# Patient Record
Sex: Female | Born: 2000
Health system: Southern US, Community
[De-identification: ages and names within clinical notes are randomized; demographics above are authoritative.]

## PROBLEM LIST (undated history)

## (undated) DIAGNOSIS — I10 Essential (primary) hypertension: Secondary | ICD-10-CM

## (undated) DIAGNOSIS — F99 Mental disorder, not otherwise specified: Secondary | ICD-10-CM

## (undated) HISTORY — PX: NO PAST SURGERIES: SHX2092

---

## 2019-11-13 ENCOUNTER — Other Ambulatory Visit: Payer: Self-pay

## 2019-11-13 ENCOUNTER — Emergency Department (HOSPITAL_COMMUNITY)
Admission: EM | Admit: 2019-11-13 | Discharge: 2019-11-13 | Discharge status: Against Medical Advice | Payer: BC Managed Care – PPO | Source: Home / Self Care | Attending: Emergency Medicine | Admitting: Emergency Medicine

## 2019-11-13 ENCOUNTER — Emergency Department (HOSPITAL_COMMUNITY): Payer: BC Managed Care – PPO

## 2019-11-13 ENCOUNTER — Encounter (HOSPITAL_COMMUNITY): Payer: Self-pay

## 2019-11-13 DIAGNOSIS — J982 Interstitial emphysema: Secondary | ICD-10-CM | POA: Diagnosis not present

## 2019-11-13 DIAGNOSIS — R000 Tachycardia, unspecified: Secondary | ICD-10-CM | POA: Insufficient documentation

## 2019-11-13 DIAGNOSIS — Z5320 Procedure and treatment not carried out because of patient's decision for unspecified reasons: Secondary | ICD-10-CM | POA: Insufficient documentation

## 2019-11-13 LAB — BASIC METABOLIC PANEL
Anion gap: 20 — ABNORMAL HIGH (ref 5–15)
BUN: 22 mg/dL — ABNORMAL HIGH (ref 6–20)
CO2: 21 mmol/L — ABNORMAL LOW (ref 22–32)
Calcium: 10.3 mg/dL (ref 8.9–10.3)
Chloride: 95 mmol/L — ABNORMAL LOW (ref 98–111)
Creatinine, Ser: 1.48 mg/dL — ABNORMAL HIGH (ref 0.44–1.00)
GFR calc Af Amer: 58 mL/min — ABNORMAL LOW (ref >60)
GFR calc non Af Amer: 50 mL/min — ABNORMAL LOW (ref >60)
Glucose, Bld: 102 mg/dL — ABNORMAL HIGH (ref 70–99)
Potassium: 2.5 mmol/L — CL (ref 3.5–5.1)
Sodium: 136 mmol/L (ref 135–145)

## 2019-11-13 LAB — DIFFERENTIAL
Abs Immature Granulocytes: 0.23 10*3/uL — ABNORMAL HIGH (ref 0.00–0.07)
Basophils Absolute: 0.1 10*3/uL (ref 0.0–0.1)
Basophils Relative: 1 %
Eosinophils Absolute: 0 10*3/uL (ref 0.0–0.5)
Eosinophils Relative: 0 %
Immature Granulocytes: 1 %
Lymphocytes Relative: 3 %
Lymphs Abs: 1.1 10*3/uL (ref 0.7–4.0)
Monocytes Absolute: 1.8 10*3/uL — ABNORMAL HIGH (ref 0.1–1.0)
Monocytes Relative: 5 %
Neutro Abs: 31 10*3/uL — ABNORMAL HIGH (ref 1.7–7.7)
Neutrophils Relative %: 91 %

## 2019-11-13 LAB — CBC
HCT: 43.1 % (ref 36.0–46.0)
Hemoglobin: 15.4 g/dL — ABNORMAL HIGH (ref 12.0–15.0)
MCH: 29.3 pg (ref 26.0–34.0)
MCHC: 35.7 g/dL (ref 30.0–36.0)
MCV: 82.1 fL (ref 80.0–100.0)
Platelets: 486 10*3/uL — ABNORMAL HIGH (ref 150–400)
RBC: 5.25 MIL/uL — ABNORMAL HIGH (ref 3.87–5.11)
RDW: 12.5 % (ref 11.5–15.5)
WBC: 34.4 10*3/uL — ABNORMAL HIGH (ref 4.0–10.5)
nRBC: 0 % (ref 0.0–0.2)

## 2019-11-13 LAB — TSH: TSH: 0.726 u[IU]/mL (ref 0.350–4.500)

## 2019-11-13 LAB — I-STAT BETA HCG BLOOD, ED (MC, WL, AP ONLY): I-stat hCG, quantitative: <5 m[IU]/mL (ref <5)

## 2019-11-13 LAB — TROPONIN I (HIGH SENSITIVITY): Troponin I (High Sensitivity): 22 ng/L — ABNORMAL HIGH (ref <18)

## 2019-11-13 MED ORDER — SODIUM CHLORIDE 0.9 % IV BOLUS: 1000.0000 mL | Freq: Once | INTRAVENOUS | Status: DC

## 2019-11-13 MED ORDER — SODIUM CHLORIDE 0.9% FLUSH: 3.0000 mL | Freq: Once | INTRAVENOUS | Status: DC

## 2019-11-13 NOTE — ED Triage Notes (Signed)
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 Patient was knocking on other people's apartment doors and went into one apartment and used their bathroom. Other persons stated that the patient was having bizarre behavior. No SI/HI. Patient has bruising on the right neck and right knee. Patient also has straw in her hair.

## 2019-11-13 NOTE — ED Notes (Signed)
Altered over radio that pt pulled out her IV. When this RN went to evaluate the patient, she was not in her room, not in the restrooms or nearby. Assumed that she left. Patient was alert and oriented x4, and ambulatory. Patient also denied SI and HI.

## 2019-11-13 NOTE — ED Provider Notes (Cosign Needed)
[facility] DEPT Provider Note   CSN: [id number] Arrival date & time: 11/13/19  1418     History No chief complaint on file.   Susan Morgan is a 19 y.o. female.  HPI    Patient is a 19 year old female who presents to the emergency department today for evaluation of abnormal behavior.  Patient was brought in by EMS and additional report was obtained from them.  She is a [employer] at [employer].  Reportedly, she was knocking on other people's apartment doors and went into an apartment and used her bathroom.    On my evaluation, patient states she does not know why EMS took her here.  She denies any medical complaints at this time.  She denies SI, HI, AVH.  She denies any drug or alcohol use.  History reviewed. No pertinent past medical history.  There are no problems to display for this patient.   History reviewed. No pertinent surgical history.   OB History   No obstetric history on file.     No family history on file.  Social History   Tobacco Use  . Smoking status: Never Smoker  . Smokeless tobacco: Never Used  Substance Use Topics  . Alcohol use: Never  . Drug use: Never    Home Medications Prior to Admission medications   Not on File    Allergies    Patient has no known allergies.  Review of Systems   Review of Systems  Constitutional: Negative for fever.  HENT: Negative for ear pain and sore throat.   Eyes: Negative for visual disturbance.  Respiratory: Negative for cough and shortness of breath.   Cardiovascular: Negative for chest pain.  Gastrointestinal: Negative for abdominal pain, constipation, diarrhea, nausea and vomiting.  Genitourinary: Negative for dysuria and hematuria.  Musculoskeletal: Negative for back pain.  Skin: Negative for rash.  Neurological: Negative for headaches.  Psychiatric/Behavioral:       Denies si, hi or avh  All other systems reviewed and are negative.   Physical Exam Updated Vital  Signs BP (!) 143/104   Pulse (!) 128   Temp 98.9 F (37.2 C) (Oral)   Resp 17   SpO2 100%   Physical Exam Vitals and nursing note reviewed.  Constitutional:      General: She is not in acute distress.    Appearance: She is well-developed.  HENT:     Head: Normocephalic and atraumatic.  Eyes:     Conjunctiva/sclera: Conjunctivae normal.  Cardiovascular:     Rate and Rhythm: Regular rhythm. Tachycardia present.     Heart sounds: Normal heart sounds. No murmur.  Pulmonary:     Effort: Pulmonary effort is normal. No respiratory distress.     Breath sounds: Normal breath sounds. No wheezing, rhonchi or rales.  Abdominal:     General: Bowel sounds are normal.     Palpations: Abdomen is soft.     Tenderness: There is no abdominal tenderness.  Musculoskeletal:     Cervical back: Neck supple.  Skin:    General: Skin is warm and dry.  Neurological:     Mental Status: She is alert.     Comments: Alert and oriented x3. Clear speech. No facial droop. Moving all extremities. Answers questions appropriately and follows commands.   Psychiatric:        Attention and Perception: Attention normal.        Mood and Affect: Mood is anxious (mild).        Speech: Speech  normal.        Behavior: Behavior is cooperative.        Thought Content: Thought content is not paranoid or delusional. Thought content does not include homicidal or suicidal ideation. Thought content does not include homicidal or suicidal plan.        Cognition and Memory: Memory normal.        Judgment: Judgment is not inappropriate.     Comments: Able to state president     ED Results / Procedures / Treatments   Labs (all labs ordered are listed, but only abnormal results are displayed) Labs Reviewed  BASIC METABOLIC PANEL - Abnormal; Notable for the following components:      Result Value   Potassium 2.5 (*)    Chloride 95 (*)    CO2 21 (*)    Glucose, Bld 102 (*)    BUN 22 (*)    Creatinine, Ser 1.48 (*)     GFR calc non Af Amer 50 (*)    GFR calc Af Amer 58 (*)    Anion gap 20 (*)    All other components within normal limits  CBC - Abnormal; Notable for the following components:   WBC 34.4 (*)    RBC 5.25 (*)    Hemoglobin 15.4 (*)    Platelets 486 (*)    All other components within normal limits  DIFFERENTIAL - Abnormal; Notable for the following components:   Neutro Abs 31.0 (*)    Monocytes Absolute 1.8 (*)    Abs Immature Granulocytes 0.23 (*)    All other components within normal limits  TROPONIN I (HIGH SENSITIVITY) - Abnormal; Notable for the following components:   Troponin I (High Sensitivity) 22 (*)    All other components within normal limits  TSH  I-STAT BETA HCG BLOOD, ED ([facility], [facility], [facility] ONLY)  TROPONIN I (HIGH SENSITIVITY)    EKG None  Radiology DG Chest Port 1 View  Result Date: 11/13/2019 CLINICAL DATA:  Tachycardia EXAM: PORTABLE CHEST 1 VIEW COMPARISON:  None. FINDINGS: The heart size and mediastinal contours are within normal limits. Both lungs are clear. The visualized skeletal structures are unremarkable. IMPRESSION: No active disease. Electronically Signed   By: Alcide Clever M.D.   On: 11/13/2019 15:17    Procedures Procedures (including critical care time)  Medications Ordered in ED Medications - No data to display  ED Course  I have reviewed the triage vital signs and the nursing notes.  Pertinent labs & imaging results that were available during my care of the patient were reviewed by me and considered in my medical decision making (see chart for details).    MDM Rules/Calculators/A&P                      Patient is a 19 year old female who presents for evaluation of bizarre behavior.  She was brought in by EMS after she was found to be knocking on apartment doors and went into an apartment that was not hers to use the bathroom.  On my evaluation patient is not endorsing any SI, HI or AVH.  She denies any drug or alcohol use.  She has no medical  complaints at this time and her physical exam is normal other than some tachycardia and that she appears somewhat anxious.  She is in no distress.  I explained the plan that we would order laboratory work and further assess her.  After leaving the room, patient eloped from the ED.  She does not appear to meet any criteria that would warrant IVC.  After she left, her labs resulted  CBC showed a leukocytosis of 34,000 with report thrombocytosis. BMP showed hypokalemia with a potassium of 2.5.  She also had an elevated BUN/creatinine at 22 and 1.48.  She also had an elevated anion gap at 20 and a CO2 of 21. Her troponin was marginally elevated at 22 Beta-hCG was negative TSH was negative  Due to lab abnormalities patient was attempted to be contacted multiple times to advise her to come back to the ED.  5:31 PM Called the patient's phone number that was listed in the chart to inform her of her abnormal results and need to come back to the emergency department immediately.    7:59 PM Called the patient's phone number that was listed in the chart to inform her of her abnormal results and need to come back to the emergency department immediately.    I was ultimately unable to reach the patient or discuss the risks of leaving AMA.  Final Clinical Impression(s) / ED Diagnoses Final diagnoses:  Tachycardia    Rx / DC Orders ED Discharge Orders    None       Karrie Meres, PA-C 11/13/19 2352

## 2019-11-14 ENCOUNTER — Encounter (HOSPITAL_COMMUNITY): Payer: Self-pay | Admitting: Emergency Medicine

## 2019-11-14 ENCOUNTER — Emergency Department (HOSPITAL_COMMUNITY): Payer: BC Managed Care – PPO

## 2019-11-14 ENCOUNTER — Other Ambulatory Visit: Payer: Self-pay

## 2019-11-14 ENCOUNTER — Inpatient Hospital Stay (HOSPITAL_COMMUNITY)
Admission: EM | Admit: 2019-11-14 | Discharge: 2019-11-18 | DRG: 200 | Disposition: A | Discharge status: Home / Self Care | Payer: BC Managed Care – PPO | Attending: Internal Medicine | Admitting: Internal Medicine

## 2019-11-14 DIAGNOSIS — D72829 Elevated white blood cell count, unspecified: Secondary | ICD-10-CM

## 2019-11-14 DIAGNOSIS — J982 Interstitial emphysema: Secondary | ICD-10-CM | POA: Diagnosis present

## 2019-11-14 DIAGNOSIS — E876 Hypokalemia: Secondary | ICD-10-CM | POA: Diagnosis present

## 2019-11-14 DIAGNOSIS — F23 Brief psychotic disorder: Secondary | ICD-10-CM

## 2019-11-14 DIAGNOSIS — N179 Acute kidney failure, unspecified: Secondary | ICD-10-CM

## 2019-11-14 DIAGNOSIS — R4182 Altered mental status, unspecified: Principal | ICD-10-CM

## 2019-11-14 DIAGNOSIS — M6282 Rhabdomyolysis: Secondary | ICD-10-CM | POA: Diagnosis not present

## 2019-11-14 DIAGNOSIS — R7989 Other specified abnormal findings of blood chemistry: Secondary | ICD-10-CM | POA: Diagnosis not present

## 2019-11-14 DIAGNOSIS — E860 Dehydration: Secondary | ICD-10-CM | POA: Diagnosis present

## 2019-11-14 DIAGNOSIS — F2081 Schizophreniform disorder: Secondary | ICD-10-CM | POA: Diagnosis present

## 2019-11-14 DIAGNOSIS — Z20822 Contact with and (suspected) exposure to covid-19: Secondary | ICD-10-CM | POA: Diagnosis present

## 2019-11-14 DIAGNOSIS — R7401 Elevation of levels of liver transaminase levels: Secondary | ICD-10-CM | POA: Diagnosis not present

## 2019-11-14 DIAGNOSIS — R000 Tachycardia, unspecified: Secondary | ICD-10-CM | POA: Diagnosis not present

## 2019-11-14 DIAGNOSIS — K229 Disease of esophagus, unspecified: Secondary | ICD-10-CM

## 2019-11-14 HISTORY — DX: Acute kidney failure, unspecified: N17.9

## 2019-11-14 HISTORY — DX: Brief psychotic disorder: F23

## 2019-11-14 LAB — COMPREHENSIVE METABOLIC PANEL
ALT: 22 U/L (ref 0–44)
AST: 44 U/L — ABNORMAL HIGH (ref 15–41)
Albumin: 3.1 g/dL — ABNORMAL LOW (ref 3.5–5.0)
Alkaline Phosphatase: 37 U/L — ABNORMAL LOW (ref 38–126)
Anion gap: 11 (ref 5–15)
BUN: 8 mg/dL (ref 6–20)
CO2: 20 mmol/L — ABNORMAL LOW (ref 22–32)
Calcium: 8.1 mg/dL — ABNORMAL LOW (ref 8.9–10.3)
Chloride: 106 mmol/L (ref 98–111)
Creatinine, Ser: 0.81 mg/dL (ref 0.44–1.00)
GFR calc Af Amer: >60 mL/min (ref >60)
GFR calc non Af Amer: >60 mL/min (ref >60)
Glucose, Bld: 75 mg/dL (ref 70–99)
Potassium: 3.4 mmol/L — ABNORMAL LOW (ref 3.5–5.1)
Sodium: 137 mmol/L (ref 135–145)
Total Bilirubin: 1 mg/dL (ref 0.3–1.2)
Total Protein: 5.7 g/dL — ABNORMAL LOW (ref 6.5–8.1)

## 2019-11-14 LAB — CBC
HCT: 42.5 % (ref 36.0–46.0)
Hemoglobin: 14.6 g/dL (ref 12.0–15.0)
MCH: 28.1 pg (ref 26.0–34.0)
MCHC: 34.4 g/dL (ref 30.0–36.0)
MCV: 81.7 fL (ref 80.0–100.0)
Platelets: 461 10*3/uL — ABNORMAL HIGH (ref 150–400)
RBC: 5.2 MIL/uL — ABNORMAL HIGH (ref 3.87–5.11)
RDW: 12.2 % (ref 11.5–15.5)
WBC: 24.1 10*3/uL — ABNORMAL HIGH (ref 4.0–10.5)
nRBC: 0 % (ref 0.0–0.2)

## 2019-11-14 LAB — CSF CELL COUNT WITH DIFFERENTIAL
RBC Count, CSF: 2090 /mm3 — ABNORMAL HIGH
RBC Count, CSF: 66 /mm3 — ABNORMAL HIGH
Tube #: 1
Tube #: 4
WBC, CSF: 0 /mm3 (ref 0–5)
WBC, CSF: 2 /mm3 (ref 0–5)

## 2019-11-14 LAB — CBG MONITORING, ED: Glucose-Capillary: 70 mg/dL (ref 70–99)

## 2019-11-14 LAB — URINALYSIS, ROUTINE W REFLEX MICROSCOPIC
Bilirubin Urine: NEGATIVE
Glucose, UA: NEGATIVE mg/dL
Ketones, ur: 20 mg/dL — AB
Nitrite: NEGATIVE
Protein, ur: NEGATIVE mg/dL
Specific Gravity, Urine: 1.026 (ref 1.005–1.030)
pH: 6 (ref 5.0–8.0)

## 2019-11-14 LAB — I-STAT BETA HCG BLOOD, ED (MC, WL, AP ONLY): I-stat hCG, quantitative: <5 m[IU]/mL

## 2019-11-14 LAB — COMPREHENSIVE METABOLIC PANEL WITH GFR
ALT: 27 U/L (ref 0–44)
AST: 63 U/L — ABNORMAL HIGH (ref 15–41)
Albumin: 5.4 g/dL — ABNORMAL HIGH (ref 3.5–5.0)
Alkaline Phosphatase: 60 U/L (ref 38–126)
Anion gap: 19 — ABNORMAL HIGH (ref 5–15)
BUN: 19 mg/dL (ref 6–20)
CO2: 21 mmol/L — ABNORMAL LOW (ref 22–32)
Calcium: 10 mg/dL (ref 8.9–10.3)
Chloride: 92 mmol/L — ABNORMAL LOW (ref 98–111)
Creatinine, Ser: 1.39 mg/dL — ABNORMAL HIGH (ref 0.44–1.00)
GFR calc Af Amer: >60 mL/min
GFR calc non Af Amer: 54 mL/min — ABNORMAL LOW
Glucose, Bld: 87 mg/dL (ref 70–99)
Potassium: 3.1 mmol/L — ABNORMAL LOW (ref 3.5–5.1)
Sodium: 132 mmol/L — ABNORMAL LOW (ref 135–145)
Total Bilirubin: 1.9 mg/dL — ABNORMAL HIGH (ref 0.3–1.2)
Total Protein: 9.4 g/dL — ABNORMAL HIGH (ref 6.5–8.1)

## 2019-11-14 LAB — ACETAMINOPHEN LEVEL: Acetaminophen (Tylenol), Serum: <10 ug/mL — ABNORMAL LOW (ref 10–30)

## 2019-11-14 LAB — PROTEIN AND GLUCOSE, CSF
Glucose, CSF: 65 mg/dL (ref 40–70)
Total  Protein, CSF: 22 mg/dL (ref 15–45)

## 2019-11-14 LAB — LACTIC ACID, PLASMA
Lactic Acid, Venous: 2.3 mmol/L (ref 0.5–1.9)
Lactic Acid, Venous: 1.4 mmol/L (ref 0.5–1.9)

## 2019-11-14 LAB — TROPONIN I (HIGH SENSITIVITY)
Troponin I (High Sensitivity): 32 ng/L — ABNORMAL HIGH (ref <18)
Troponin I (High Sensitivity): 23 ng/L — ABNORMAL HIGH (ref <18)

## 2019-11-14 LAB — RAPID URINE DRUG SCREEN, HOSP PERFORMED
Amphetamines: NOT DETECTED
Barbiturates: NOT DETECTED
Benzodiazepines: NOT DETECTED
Cocaine: NOT DETECTED
Opiates: NOT DETECTED
Tetrahydrocannabinol: NOT DETECTED

## 2019-11-14 LAB — RESPIRATORY PANEL BY RT PCR (FLU A&B, COVID)
Influenza A by PCR: NEGATIVE
Influenza B by PCR: NEGATIVE
SARS Coronavirus 2 by RT PCR: NEGATIVE

## 2019-11-14 LAB — CK: Total CK: 2251 U/L — ABNORMAL HIGH (ref 38–234)

## 2019-11-14 LAB — MAGNESIUM: Magnesium: 2.3 mg/dL (ref 1.7–2.4)

## 2019-11-14 LAB — SALICYLATE LEVEL: Salicylate Lvl: <7 mg/dL — ABNORMAL LOW (ref 7.0–30.0)

## 2019-11-14 LAB — HIV ANTIBODY (ROUTINE TESTING W REFLEX): HIV Screen 4th Generation wRfx: NONREACTIVE

## 2019-11-14 MED ORDER — LORAZEPAM 2 MG/ML IJ SOLN
1.0000 mg | Freq: Once | INTRAMUSCULAR | Status: AC
  Administered 2019-11-14: 1 mg via INTRAVENOUS
  Filled 2019-11-14: qty 1

## 2019-11-14 MED ORDER — LACTATED RINGERS IV SOLN: INTRAVENOUS | Status: AC

## 2019-11-14 MED ORDER — METRONIDAZOLE IN NACL 5-0.79 MG/ML-% IV SOLN: 500.0000 mg | Freq: Three times a day (TID) | INTRAVENOUS | Status: DC

## 2019-11-14 MED ORDER — ACETAMINOPHEN 650 MG RE SUPP: 650.0000 mg | Freq: Four times a day (QID) | RECTAL | Status: DC | PRN

## 2019-11-14 MED ORDER — SODIUM CHLORIDE 0.9% FLUSH
3.0000 mL | Freq: Once | INTRAVENOUS | Status: AC
  Administered 2019-11-14: 3 mL via INTRAVENOUS

## 2019-11-14 MED ORDER — ONDANSETRON HCL 4 MG/2ML IJ SOLN: 4.0000 mg | Freq: Four times a day (QID) | INTRAMUSCULAR | Status: DC | PRN

## 2019-11-14 MED ORDER — POTASSIUM CHLORIDE 10 MEQ/100ML IV SOLN
10.0000 meq | Freq: Once | INTRAVENOUS | Status: AC
  Administered 2019-11-14: 10 meq via INTRAVENOUS
  Filled 2019-11-14: qty 100

## 2019-11-14 MED ORDER — VANCOMYCIN HCL IN DEXTROSE 1-5 GM/200ML-% IV SOLN
1000.0000 mg | Freq: Once | INTRAVENOUS | Status: AC
  Administered 2019-11-14: 1000 mg via INTRAVENOUS
  Filled 2019-11-14: qty 200

## 2019-11-14 MED ORDER — SODIUM CHLORIDE 0.9 % IV BOLUS (SEPSIS)
1000.0000 mL | Freq: Once | INTRAVENOUS | Status: AC
  Administered 2019-11-14: 1000 mL via INTRAVENOUS

## 2019-11-14 MED ORDER — ENOXAPARIN SODIUM 40 MG/0.4ML ~~LOC~~ SOLN
40.0000 mg | SUBCUTANEOUS | Status: DC
  Administered 2019-11-14 – 2019-11-17 (×4): 40 mg via SUBCUTANEOUS
  Filled 2019-11-14 (×4): qty 0.4

## 2019-11-14 MED ORDER — PROPOFOL 10 MG/ML IV BOLUS
1.0000 mg/kg | Freq: Once | INTRAVENOUS | Status: AC
  Administered 2019-11-14: 54.4 mg via INTRAVENOUS
  Filled 2019-11-14: qty 20

## 2019-11-14 MED ORDER — VANCOMYCIN HCL 750 MG/150ML IV SOLN
750.0000 mg | Freq: Two times a day (BID) | INTRAVENOUS | Status: DC
  Administered 2019-11-14 – 2019-11-15 (×2): 750 mg via INTRAVENOUS
  Filled 2019-11-14 (×2): qty 150

## 2019-11-14 MED ORDER — VANCOMYCIN HCL IN DEXTROSE 750-5 MG/150ML-% IV SOLN: 750.0000 mg | Freq: Two times a day (BID) | INTRAVENOUS | Status: DC

## 2019-11-14 MED ORDER — SODIUM CHLORIDE 0.9 % IV BOLUS (SEPSIS)
500.0000 mL | Freq: Once | INTRAVENOUS | Status: AC
  Administered 2019-11-14: 500 mL via INTRAVENOUS

## 2019-11-14 MED ORDER — ONDANSETRON HCL 4 MG PO TABS: 4.0000 mg | ORAL_TABLET | Freq: Four times a day (QID) | ORAL | Status: DC | PRN

## 2019-11-14 MED ORDER — ACETAMINOPHEN 325 MG PO TABS: 650.0000 mg | ORAL_TABLET | Freq: Four times a day (QID) | ORAL | Status: DC | PRN

## 2019-11-14 MED ORDER — SODIUM CHLORIDE 0.9 % IV SOLN
2.0000 g | Freq: Two times a day (BID) | INTRAVENOUS | Status: DC
  Administered 2019-11-14 – 2019-11-15 (×3): 2 g via INTRAVENOUS
  Filled 2019-11-14 (×2): qty 2
  Filled 2019-11-14 (×2): qty 20

## 2019-11-14 MED ORDER — SODIUM CHLORIDE 0.9% FLUSH
3.0000 mL | Freq: Two times a day (BID) | INTRAVENOUS | Status: DC
  Administered 2019-11-15 – 2019-11-18 (×6): 3 mL via INTRAVENOUS

## 2019-11-14 MED ORDER — IOHEXOL 300 MG/ML  SOLN
75.0000 mL | Freq: Once | INTRAMUSCULAR | Status: AC | PRN
  Administered 2019-11-14: 75 mL via INTRAVENOUS

## 2019-11-14 MED ORDER — SODIUM CHLORIDE 0.9 % IV SOLN
2.0000 g | Freq: Once | INTRAVENOUS | Status: AC
  Administered 2019-11-14: 2 g via INTRAVENOUS
  Filled 2019-11-14: qty 2

## 2019-11-14 MED ORDER — SODIUM CHLORIDE 0.9 % IV BOLUS
500.0000 mL | Freq: Once | INTRAVENOUS | Status: AC
  Administered 2019-11-14: 500 mL via INTRAVENOUS

## 2019-11-14 MED ORDER — POTASSIUM CHLORIDE CRYS ER 20 MEQ PO TBCR
40.0000 meq | EXTENDED_RELEASE_TABLET | Freq: Once | ORAL | Status: AC
  Administered 2019-11-14: 40 meq via ORAL
  Filled 2019-11-14: qty 2

## 2019-11-14 MED ORDER — SODIUM CHLORIDE 0.9 % IV BOLUS
1000.0000 mL | Freq: Once | INTRAVENOUS | Status: AC
  Administered 2019-11-14: 1000 mL via INTRAVENOUS

## 2019-11-14 MED ORDER — METRONIDAZOLE IN NACL 5-0.79 MG/ML-% IV SOLN
500.0000 mg | Freq: Once | INTRAVENOUS | Status: AC
  Administered 2019-11-14: 500 mg via INTRAVENOUS
  Filled 2019-11-14: qty 100

## 2019-11-14 NOTE — ED Provider Notes (Signed)
[facility] EMERGENCY DEPARTMENT Provider Note  CSN: [id number] Arrival date & time: 11/14/19 0202  Chief Complaint(s) Altered Mental Status  HPI Susan Morgan is a 19 y.o. female brought into the emergency department by [employer] for altered mental status.  They were called out by patient's roommates had a college apartment complex.  The patient's roommate reports that they have not seen her all day.  He states that she is very confused and acting strange.  Patient was actually Seen at [facility] long earlier in the day note reports that the patient was reportedly locking on other people's apartment doors.  She entered an apartment and use the bathroom.   Patient is confused but oriented to time.  She believes she is in [place] currently.  Patient did endorse to smoking "trees."  Denied any alcohol use or drug use.  We do not have any contact information for family.  During her work-up at [facility] long it was noted that patient had abnormal labs including leukocytosis on CBC with evidence of hemoconcentration.  Metabolic panel notable for mild AKI and mild hypokalemia.  Her troponin at time time was also mildly elevated.  Remainder of history, ROS, and physical exam limited due to patient's condition ([facility]). Additional information was obtained from [employer] and chart.   Level V Caveat.    HPI  Past Medical History History reviewed. No pertinent past medical history. There are no problems to display for this patient.  Home Medication(s) Prior to Admission medications   Not on File                                                                                                                                    Past Surgical History History reviewed. No pertinent surgical history. Family History No family history on file.  Social History Social History   Tobacco Use  . Smoking status: Never Smoker  . Smokeless tobacco: Never Used  Substance Use Topics  . Alcohol use:  Never  . Drug use: Never   Allergies Patient has no known allergies.  Review of Systems Review of Systems  Unable to perform ROS: Mental status change    Physical Exam Vital Signs  I have reviewed the triage vital signs BP (!) 143/112 (BP Location: Right Arm)   Pulse (!) 128   Temp 98 F (36.7 C) (Oral)   Resp 16   SpO2 100%   Physical Exam Vitals reviewed.  Constitutional:      General: She is not in acute distress.    Appearance: She is well-developed. She is not diaphoretic.  HENT:     Head: Normocephalic and atraumatic.     Nose: Nose normal.  Eyes:     General: No scleral icterus.       Right eye: No discharge.        Left eye: No discharge.     Conjunctiva/sclera: Conjunctivae normal.  Pupils: Pupils are equal, round, and reactive to light.  Cardiovascular:     Rate and Rhythm: Normal rate and regular rhythm.     Heart sounds: No murmur. No friction rub. No gallop.   Pulmonary:     Effort: Pulmonary effort is normal. No respiratory distress.     Breath sounds: Normal breath sounds. No stridor. No rales.  Abdominal:     General: There is no distension.     Palpations: Abdomen is soft.     Tenderness: There is no abdominal tenderness.  Musculoskeletal:        General: No tenderness.     Cervical back: Normal range of motion and neck supple.  Skin:    General: Skin is warm and dry.     Findings: No erythema or rash.  Neurological:     Mental Status: She is alert.     Sensory: Sensation is intact.     Motor: Motor function is intact.     Gait: Gait is intact.  Psychiatric:        Mood and Affect: Mood is anxious.        Speech: Speech is rapid and pressured and tangential.        Behavior: Behavior is hyperactive.     ED Results and Treatments Labs (all labs ordered are listed, but only abnormal results are displayed) Labs Reviewed  COMPREHENSIVE METABOLIC PANEL - Abnormal; Notable for the following components:      Result Value   Sodium 132  (*)    Potassium 3.1 (*)    Chloride 92 (*)    CO2 21 (*)    Creatinine, Ser 1.39 (*)    Total Protein 9.4 (*)    Albumin 5.4 (*)    AST 63 (*)    Total Bilirubin 1.9 (*)    GFR calc non Af Amer 54 (*)    Anion gap 19 (*)    All other components within normal limits  CBC - Abnormal; Notable for the following components:   WBC 24.1 (*)    RBC 5.20 (*)    Platelets 461 (*)    All other components within normal limits  SALICYLATE LEVEL - Abnormal; Notable for the following components:   Salicylate Lvl <7.0 (*)    All other components within normal limits  ACETAMINOPHEN LEVEL - Abnormal; Notable for the following components:   Acetaminophen (Tylenol), Serum <10 (*)    All other components within normal limits  LACTIC ACID, PLASMA - Abnormal; Notable for the following components:   Lactic Acid, Venous 2.3 (*)    All other components within normal limits  TROPONIN I (HIGH SENSITIVITY) - Abnormal; Notable for the following components:   Troponin I (High Sensitivity) 32 (*)    All other components within normal limits  CSF CULTURE  GRAM STAIN  CULTURE, BLOOD (ROUTINE X 2)  CULTURE, BLOOD (ROUTINE X 2)  URINE CULTURE  RESPIRATORY PANEL BY RT PCR (FLU A&B, COVID)  MAGNESIUM  LACTIC ACID, PLASMA  URINALYSIS, ROUTINE W REFLEX MICROSCOPIC  RAPID URINE DRUG SCREEN, HOSP PERFORMED  CSF CELL COUNT WITH DIFFERENTIAL  CSF CELL COUNT WITH DIFFERENTIAL  PROTEIN AND GLUCOSE, CSF  VDRL, CSF  CBG MONITORING, ED  I-STAT BETA HCG BLOOD, ED ([facility], [facility], [facility] ONLY)  EKG  EKG Interpretation  Date/Time:    Ventricular Rate:    PR Interval:    QRS Duration:   QT Interval:    QTC Calculation:   R Axis:     Text Interpretation:        Radiology CT Head Wo Contrast  Result Date: 11/14/2019 CLINICAL DATA:  Encephalopathy EXAM: CT HEAD WITHOUT CONTRAST TECHNIQUE: Contiguous  axial images were obtained from the base of the skull through the vertex without intravenous contrast. COMPARISON:  None. FINDINGS: Brain: There is no mass, hemorrhage or extra-axial collection. The size and configuration of the ventricles and extra-axial CSF spaces are normal. The brain parenchyma is normal, without acute or chronic infarction. Vascular: No abnormal hyperdensity of the major intracranial arteries or dural venous sinuses. No intracranial atherosclerosis. Skull: The visualized skull base, calvarium and extracranial soft tissues are normal. Sinuses/Orbits: No fluid levels or advanced mucosal thickening of the visualized paranasal sinuses. No mastoid or middle ear effusion. The orbits are normal. Other: There is a large amount of retropharyngeal gas, incompletely visualized. This likely indicates air ascending from pneumomediastinum. CT of the neck is recommended. IMPRESSION: 1. No acute intracranial abnormality. 2. Large amount of retropharyngeal gas, incompletely visualized. This likely indicates air ascending from pneumomediastinum. CT of the neck is recommended. Electronically Signed   By: Ulyses Jarred M.D.   On: 11/14/2019 05:52   DG Chest Port 1 View  Result Date: 11/14/2019 CLINICAL DATA:  Tachycardia. EXAM: PORTABLE CHEST 1 VIEW COMPARISON:  November 13, 2019. FINDINGS: The heart size and mediastinal contours are within normal limits. Both lungs are clear. No pneumothorax or pleural effusion is noted. The visualized skeletal structures are unremarkable. IMPRESSION: No active disease. Electronically Signed   By: Marijo Conception M.D.   On: 11/14/2019 08:02   DG Chest Port 1 View  Result Date: 11/13/2019 CLINICAL DATA:  Tachycardia EXAM: PORTABLE CHEST 1 VIEW COMPARISON:  None. FINDINGS: The heart size and mediastinal contours are within normal limits. Both lungs are clear. The visualized skeletal structures are unremarkable. IMPRESSION: No active disease. Electronically Signed   By: Inez Catalina M.D.   On: 11/13/2019 15:17    Pertinent labs & imaging results that were available during my care of the patient were reviewed by me and considered in my medical decision making (see chart for details).  Medications Ordered in ED Medications  sodium chloride flush (NS) 0.9 % injection 3 mL (has no administration in time range)  sodium chloride 0.9 % bolus 1,000 mL (0 mLs Intravenous Stopped 11/14/19 0823)    And  sodium chloride 0.9 % bolus 500 mL (has no administration in time range)  vancomycin (VANCOCIN) IVPB 1000 mg/200 mL premix (has no administration in time range)  LORazepam (ATIVAN) injection 1 mg (1 mg Intravenous Given 11/14/19 0529)  LORazepam (ATIVAN) injection 1 mg (1 mg Intravenous Given 11/14/19 0553)  ceFEPIme (MAXIPIME) 2 g in sodium chloride 0.9 % 100 mL IVPB (0 g Intravenous Stopped 11/14/19 0823)  metroNIDAZOLE (FLAGYL) IVPB 500 mg (500 mg Intravenous New Bag/Given 11/14/19 0706)  iohexol (OMNIPAQUE) 300 MG/ML solution 75 mL (75 mLs Intravenous Contrast Given 11/14/19 0733)  Procedures .Critical Care Performed by: Nira Conn, MD Authorized by: Nira Conn, MD    .Lumbar Puncture  Date/Time: 11/14/2019 8:36 AM Performed by: Nira Conn, MD Authorized by: Nira Conn, MD   Consent:    Consent obtained:  Verbal   Consent given by:  Parent   Risks discussed:  Infection, nerve damage and repeat procedure   Alternatives discussed:  Delayed treatment Pre-procedure details:    Procedure purpose:  Diagnostic   Preparation: Patient was prepped and draped in usual sterile fashion   Sedation:    Sedation type:  Anxiolysis and moderate (conscious) sedation Anesthesia (see MAR for exact dosages):    Anesthesia method:  Local infiltration   Local anesthetic:  Lidocaine 1% w/o epi Procedure  details:    Lumbar space:  L3-L4 interspace   Patient position:  L lateral decubitus Comments:     Unsuccessful due to need for more sedation   CRITICAL CARE Performed by: Amadeo Garnet Leif Loflin Total critical care time: 70 minutes Critical care time was exclusive of separately billable procedures and treating other patients. Critical care was necessary to treat or prevent imminent or life-threatening deterioration. Critical care was time spent personally by me on the following activities: development of treatment plan with patient and/or surrogate as well as nursing, discussions with consultants, evaluation of patient's response to treatment, examination of patient, obtaining history from patient or surrogate, ordering and performing treatments and interventions, ordering and review of laboratory studies, ordering and review of radiographic studies, pulse oximetry and re-evaluation of patient's condition.   (including critical care time)  Medical Decision Making / ED Course I have reviewed the nursing notes for this encounter and the patient's prior records (if available in EHR or on provided paperwork).   Susan Morgan was evaluated in Emergency Department on 11/14/2019 for the symptoms described in the history of present illness. She was evaluated in the context of the global COVID-19 pandemic, which necessitated consideration that the patient might be at risk for infection with the SARS-CoV-2 virus that causes COVID-19. Institutional protocols and algorithms that pertain to the evaluation of patients at risk for COVID-19 are in a state of rapid change based on information released by regulatory bodies including the CDC and federal and state organizations. These policies and algorithms were followed during the patient's care in the ED.  Altered mental status Possible substance related. However given previous labs will need to rule out organic cause including encephalitis. Repeat labs  appear to be improving with lower leukocytosis, improving potassium and renal function. EKG still with sinus tachycardia. Patient is hyperactive and difficult to redirect.  Chemical sedation was necessary to continue with appropriate work-up including CT scan and lumbar puncture.  CT head showed no intracranial normality however we did notice subcutaneous air in the soft tissue of the neck.  Work-up was expanded and CT neck and chest were ordered.  Chest x-ray repeated which showed evidence of pneumomediastinum with air around the cardiac silhouette.   Labs resulting in notable for elevated lactic acid and uptrending troponin.   Code sepsis was initiated and patient was started on empiric antibiotics.  CT chest confirmed pneumomediastinum. Radiology read pending.  LP attempt unsuccessful due to need for more sedation.  Patient care turned over to Dr Criss Alvine. Patient case and results discussed in detail; please see their note for further ED managment.         Final Clinical Impression(s) / ED Diagnoses Final diagnoses:  None  This chart was dictated using voice recognition software.  Despite best efforts to proofread,  errors can occur which can change the documentation meaning.   Nira Conn, MD 11/14/19 [phone]

## 2019-11-14 NOTE — ED Notes (Signed)
PT to CT.

## 2019-11-14 NOTE — Progress Notes (Signed)
Pharmacy Antibiotic Note  Susan Morgan is a 19 y.o. female admitted on 11/14/2019 with sepsis, possible ruptured esophagus, concern for CNS infection.  Pharmacy has been consulted for vancomycin and ceftriaxone dosing.  Plan: Vancomycin 1000mg  IV x 1, then 750mg  IV every 12 hours (nomogram dosing d/t possible CNS infection) Ceftriaxone 2g IV ever 12 hours Continue Flagyl 500mg  IV every 8 hours Monitor renal function, CSF and CNS workup to narrow Vancomycin trough as needed  Weight: 54.4 kg (120 lb)  Temp (24hrs), Avg:98.5 F (36.9 C), Min:98 F (36.7 C), Max:98.9 F (37.2 C)  Recent Labs  Lab 11/13/19 1505 11/14/19 0312 11/14/19 0515 11/14/19 0708  WBC 34.4* 24.1*  --   --   CREATININE 1.48* 1.39*  --   --   LATICACIDVEN  --   --  2.3* 1.4    CrCl cannot be calculated (Unknown ideal weight.).    No Known Allergies  11/16/19, PharmD Clinical Pharmacist ED Pharmacist Phone # [phone] 11/14/2019 11:34 AM

## 2019-11-14 NOTE — ED Triage Notes (Signed)
Pt brought to ED by Police from home, per police they were called for pt having confusion and change on behavior per friends pt was out all day long, pt is unable to tell us what is wrong.

## 2019-11-14 NOTE — Consult Note (Signed)
[address]       [place] [address]             [phone]        Susan Morgan [facility] Medical Record #[id number] Date of Birth: 2000-09-11  Referring: No ref. provider found Primary Care: Patient, No Pcp Per Primary Cardiologist:No primary care provider on file.  Chief Complaint:    Chief Complaint  Patient presents with  . Altered Mental Status   Patient examined, CT scan of chest images personally reviewed.  Gastrografin swallow study the esophagus is still pending.  History of Present Illness:     19 year old female evaluated in the ED for altered mental status and during the evaluation subcutaneous air in the neck and subsequently in the mediastinum (pneumomediastinum) was diagnosed.  The patient had been seen in the ED yesterday and appeared to be confused and illogical.  Her exam was unremarkable and the patient left AMA before her labs were fully reported and her white count was elevated at 34k.  When she returned her white count was 24,000 bicarb 21 creatinine 1.4, bilirubin 1.9 LFTs minimally elevated. The CT scan of the chest shows pneumomediastinum without pleural effusion or pulmonary infiltrates.  She was afebrile with sinus tachycardia 130.  The patient denies chest pain.  She has no tenderness of the chest to palpation.  The patient has a soft or hoarse voice.  History is difficult because of her confusion but she denies history of asthma.  She did have some episodes of emesis in the past 2 days.  No history of smoking or previous lung disease.  Current Activity/ Functional Status: Patient lives alone.  Drug screen negative.   Zubrod Score: At the time of surgery this patient's most appropriate activity status/level should be described as: []     0    Normal activity, no symptoms []     1    Restricted in physical strenuous activity but ambulatory, able to do out light work []     2    Ambulatory and capable of self care, unable to do  work activities, up and about                 more than 50%  Of the time                            []     3    Only limited self care, in bed greater than 50% of waking hours [x]     4    Completely disabled, no self care, confined to bed or chair []     5    Moribund  History reviewed. No pertinent past medical history.  History reviewed. No pertinent surgical history.  Social History   Tobacco Use  Smoking Status Never Smoker  Smokeless Tobacco Never Used    Social History   Substance and Sexual Activity  Alcohol Use Never     No Known Allergies  Current Facility-Administered Medications  Medication Dose Route Frequency Provider Last Rate Last Admin  . acetaminophen (TYLENOL) tablet 650 mg  650 mg Oral Q6H PRN Masoudi, Elhamalsadat, MD       Or  . acetaminophen (TYLENOL) suppository 650 mg  650 mg Rectal Q6H PRN Masoudi, Elhamalsadat, MD      . cefTRIAXone (ROCEPHIN) 2 g in sodium chloride 0.9 % 100 mL IVPB  2 g Intravenous Q12H , RPH      .  metroNIDAZOLE (FLAGYL) IVPB 500 mg  500 mg Intravenous Q8H Daylene Posey, RPH      . ondansetron (ZOFRAN) tablet 4 mg  4 mg Oral Q6H PRN Masoudi, Elhamalsadat, MD       Or  . ondansetron (ZOFRAN) injection 4 mg  4 mg Intravenous Q6H PRN Masoudi, Elhamalsadat, MD      . potassium chloride 10 mEq in 100 mL IVPB  10 mEq Intravenous Once Pricilla Loveless, MD      . potassium chloride SA (KLOR-CON) CR tablet 40 mEq  40 mEq Oral Once Pricilla Loveless, MD      . sodium chloride flush (NS) 0.9 % injection 3 mL  3 mL Intravenous Q12H Masoudi, Elhamalsadat, MD      . vancomycin (VANCOREADY) IVPB 750 mg/150 mL  750 mg Intravenous Q12H Tyson Alias, MD       No current outpatient medications on file.    (Not in a hospital admission)   No family history on file.   Review of Systems:   ROS Patient unable to answer review of system questions in a coherent fashion    Cardiac Review of Systems: Y or  [    ]= no  Chest  Pain [    ]  Resting SOB [   ] Exertional SOB  [  ]  Orthopnea [  ]   Pedal Edema [   ]    Palpitations [  ] Syncope  [  ]   Presyncope [   ]  General Review of Systems: [Y] = yes [  ]=no Constitional: recent weight change [  ]; anorexia [  ]; fatigue [  ]; nausea [  ]; night sweats [  ]; fever [  ]; or chills [  ]                                                               Dental: Last Dentist visit:   Eye : blurred vision [  ]; diplopia [   ]; vision changes [  ];  Amaurosis fugax[  ]; Resp: cough [  ];  wheezing[  ];  hemoptysis[  ]; shortness of breath[  ]; paroxysmal nocturnal dyspnea[  ]; dyspnea on exertion[  ]; or orthopnea[  ];  GI:  gallstones[  ], vomiting[  ];  dysphagia[  ]; melena[  ];  hematochezia [  ]; heartburn[  ];   Hx of  Colonoscopy[  ]; GU: kidney stones [  ]; hematuria[  ];   dysuria [  ];  nocturia[  ];  history of     obstruction [  ]; urinary frequency [  ]             Skin: rash, swelling[  ];, hair loss[  ];  peripheral edema[  ];  or itching[  ]; Musculosketetal: myalgias[  ];  joint swelling[  ];  joint erythema[  ];  joint pain[  ];  back pain[  ];  Heme/Lymph: bruising[  ];  bleeding[  ];  anemia[  ];  Neuro: TIA[  ];  headaches[  ];  stroke[  ];  vertigo[  ];  seizures[  ];   paresthesias[  ];  difficulty walking[  ];  Psych:depression[  ]; anxiety[  ];  Endocrine: diabetes[  ];  thyroid dysfunction[  ];                 Physical Exam: BP 104/61   Pulse 98   Temp 98.6 F (37 C) (Oral)   Resp 19   Wt 54.4 kg   SpO2 100%       Physical Exam  General: Small thin 19 year old female anxious and somewhat incoherent with soft or hoarse voice HEENT: Normocephalic pupils equal , dentition adequate Neck: Supple without JVD, adenopathy, or bruit.  No palpable subcutaneous air.  Small bruise over right neck. Chest: Clear to auscultation, symmetrical breath sounds, no rhonchi, no tenderness             or deformity.  No mediastinal crunch  sign Cardiovascular: Sinus tachycardia, regular rate and rhythm, no murmur, no gallop, peripheral pulses             palpable in all extremities Abdomen:  Soft, nontender, nondistended no palpable mass or organomegaly Extremities: Warm, well-perfused, no clubbing cyanosis edema or tenderness,              no venous stasis changes of the legs Rectal/GU: Deferred Neuro: Grossly non--focal and symmetrical throughout Skin: Clean and dry without rash or ulceration       Recent Radiology Findings:   CT Head Wo Contrast  Result Date: 11/14/2019 CLINICAL DATA:  Encephalopathy EXAM: CT HEAD WITHOUT CONTRAST TECHNIQUE: Contiguous axial images were obtained from the base of the skull through the vertex without intravenous contrast. COMPARISON:  None. FINDINGS: Brain: There is no mass, hemorrhage or extra-axial collection. The size and configuration of the ventricles and extra-axial CSF spaces are normal. The brain parenchyma is normal, without acute or chronic infarction. Vascular: No abnormal hyperdensity of the major intracranial arteries or dural venous sinuses. No intracranial atherosclerosis. Skull: The visualized skull base, calvarium and extracranial soft tissues are normal. Sinuses/Orbits: No fluid levels or advanced mucosal thickening of the visualized paranasal sinuses. No mastoid or middle ear effusion. The orbits are normal. Other: There is a large amount of retropharyngeal gas, incompletely visualized. This likely indicates air ascending from pneumomediastinum. CT of the neck is recommended. IMPRESSION: 1. No acute intracranial abnormality. 2. Large amount of retropharyngeal gas, incompletely visualized. This likely indicates air ascending from pneumomediastinum. CT of the neck is recommended. Electronically Signed   By: Ulyses Jarred M.D.   On: 11/14/2019 05:52   CT Soft Tissue Neck W Contrast  Result Date: 11/14/2019 CLINICAL DATA:  Sepsis. EXAM: CT NECK WITH CONTRAST TECHNIQUE: Multidetector  CT imaging of the neck was performed using the standard protocol following the bolus administration of intravenous contrast. CONTRAST:  12mL OMNIPAQUE IOHEXOL 300 MG/ML  SOLN COMPARISON:  None. FINDINGS: Pharynx and larynx: No evidence of pharyngeal mass or swelling. Gas throughout the retropharyngeal space. No retropharyngeal fluid collection. Widely patent airway. No foreign body or discrete pharyngeal, laryngeal, or tracheal perforation in the neck. Salivary glands: No inflammation, mass, or stone. Thyroid: Unremarkable. Lymph nodes: No enlarged or suspicious lymph nodes in the neck. Vascular: Major vascular structures of the neck are patent. Limited intracranial: Unremarkable. Visualized orbits: Unremarkable. Mastoids and visualized paranasal sinuses: Clear. Skeleton: No acute osseous abnormality or suspicious osseous lesion. Upper chest: Partially visualized pneumomediastinum, more fully evaluated on separate chest CT. Other: Pneumomediastinum extends superiorly in the neck in both carotid spaces. IMPRESSION: Pneumomediastinum extending into the neck including gas throughout the retropharyngeal space. No fluid collection or foreign body identified in the neck.  Electronically Signed   By: Sebastian Ache M.D.   On: 11/14/2019 08:19   CT Chest W Contrast  Result Date: 11/14/2019 CLINICAL DATA:  Altered mental status. Confusion. Neck CT demonstrating pneumomediastinum. EXAM: CT CHEST WITH CONTRAST TECHNIQUE: Multidetector CT imaging of the chest was performed during intravenous contrast administration. CONTRAST:  62mL OMNIPAQUE IOHEXOL 300 MG/ML  SOLN COMPARISON:  Today's chest radiograph and neck CTs, dictated separately. FINDINGS: Cardiovascular: Normal aortic caliber. Normal heart size, without pericardial effusion. Mediastinum/Nodes: No mediastinal or hilar adenopathy. Soft tissue density in the anterior mediastinum is likely residual thymus. Lungs/Pleura: No pleural fluid. No pneumothorax. Extensive  pneumomediastinum within the low neck and throughout the chest. This extends minimally along the left major fissure. Clear lungs. Upper Abdomen: Focal steatosis adjacent the falciform ligament. Normal imaged portions of the spleen, stomach, pancreas, gallbladder, adrenal glands, kidneys. Musculoskeletal: No acute osseous abnormality. IMPRESSION: Extensive pneumomediastinum, without cause identified Electronically Signed   By: Jeronimo Greaves M.D.   On: 11/14/2019 08:33   DG Chest Port 1 View  Result Date: 11/14/2019 CLINICAL DATA:  Tachycardia. EXAM: PORTABLE CHEST 1 VIEW COMPARISON:  November 13, 2019. FINDINGS: The heart size and mediastinal contours are within normal limits. Both lungs are clear. No pneumothorax or pleural effusion is noted. The visualized skeletal structures are unremarkable. IMPRESSION: No active disease. Electronically Signed   By: Lupita Raider M.D.   On: 11/14/2019 08:02   DG Chest Port 1 View  Result Date: 11/13/2019 CLINICAL DATA:  Tachycardia EXAM: PORTABLE CHEST 1 VIEW COMPARISON:  None. FINDINGS: The heart size and mediastinal contours are within normal limits. Both lungs are clear. The visualized skeletal structures are unremarkable. IMPRESSION: No active disease. Electronically Signed   By: Alcide Clever M.D.   On: 11/13/2019 15:17     I have independently reviewed the above radiologic studies and discussed with the patient   Recent Lab Findings: Lab Results  Component Value Date   WBC 24.1 (H) 11/14/2019   HGB 14.6 11/14/2019   HCT 42.5 11/14/2019   PLT 461 (H) 11/14/2019   GLUCOSE 87 11/14/2019   ALT 27 11/14/2019   AST 63 (H) 11/14/2019   NA 132 (L) 11/14/2019   K 3.1 (L) 11/14/2019   CL 92 (L) 11/14/2019   CREATININE 1.39 (H) 11/14/2019   BUN 19 11/14/2019   CO2 21 (L) 11/14/2019   TSH 0.726 11/13/2019      Assessment / Plan:   Pneumomediastinum without pneumothorax.  Etiology of the pneumothorax is probably pulmonary but spontaneous esophageal tear  needs to be ruled out with a diagnostic oral contrast esophagram which has been ordered. Recommend continuing broad-spectrum IV antibiotics and follow-up cultures.  Keep patient n.p.o. We will obtain follow up chest x-ray in a.m. to assess for development of pneumothorax.       11/14/2019 1:10 PM

## 2019-11-14 NOTE — Progress Notes (Signed)
Called patient's mother for patient. Patient still believes that she was kidnapped. She also stated that, they were going to kill her". Asked patient who. Patient stated that, the police were going to kill her". Patient maintains her story that she swam and walked thru the woods from [place].   Patient can recall mother's phone number and patient acknowledges that she is in the hospital.

## 2019-11-14 NOTE — Progress Notes (Signed)
Pt transferred to 4E-07 via stretcher from ED. Pt given CHG bath. Tele applied, CCMD notified.Pt oriented to call bell bed and room. Call bell within reach.VSS. Will continue to monitor. Theophilus Kinds, RN

## 2019-11-14 NOTE — H&P (Signed)
Date: 11/14/2019               Patient Name:  Susan Morgan MRN: [id number]  DOB: 09/13/2000 Age / Sex: 19 y.o., female   PCP: Patient, No Pcp Per         Medical Service: Internal Medicine Teaching Service         Attending Physician: Dr. Evette Doffing, Mallie Mussel, *    First Contact: Marva Panda, MD, [place] Pager: [place] [phone])  Second Contact: Myrtie Hawk, MD, [place] Pager: EM [phone])       After Hours (After 5p/  First Contact Pager: [phone]  weekends / holidays): Second Contact Pager: [phone]   Chief Complaint: AMS  History of Present Illness: 19 y.o. female w/ no significant past medical history presenting with altered mental status. Patient is unable to provide clear history and no family at bedside. History obtained via chart review and discussion with her mother via telephone.  Patient presented to the [facility] ED via EMS for abnormal behavior when she was found to be knocking on apartment doors and went into an apartment that wasn't hers to use the bathroom. She was noted to be tachycardic, but in no distress. Work up significant for leukocytosis with thrombocytosis, hypokalemia and elevated anion gap and BUN/Cr. Troponin marginally elevated at 22. However, patient eloped from the ED prior to receiving any treatment. Today, patient was brought in by the police department for AMS when her roommate called for strange behavior. Unable to obtain clear history from patient; however, she denies any chest pain or shortness of breath at this time. She denies any discomfort and is speaking in nonsensical sentences.  Per history obtained by mother, patient's last known normal was 2 days ago when she talked to her over the phone. 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 No history of drug use. However, mother does note concerns for possible bipolar disorder as she has had times of high and low energy levels.   ED course: Patient brought in by  the police department for [place]. She appeared to be disoriented in the ED. Labs significant for improving leukocytosis, hypokalemia and renal function. EKG with sinus tachycardia. Suspicion for encephalitis for which obtained CT Head and lumbar puncture. CT Head with subcutaneous air in the soft tissue of the neck. Work up expanded to CT neck and chest. CT chest with extensive pneumomediastinum for which cardiothoracic surgery consulted. Code sepsis initiated for elevated lactic acid and uptrending troponins for which patient started on empiric antibiotics. Patient admitted for further evaluation and management.    Meds:  No outpatient medications have been marked as taking for the 11/14/19 encounter [facility] Encounter).  None   Allergies: Allergies as of 11/14/2019  . (No Known Allergies)   History reviewed. No pertinent past medical history.  Family History: Unknown.   Social History:  Social History   Tobacco Use  . Smoking status: Never Smoker  . Smokeless tobacco: Never Used  Substance Use Topics  . Alcohol use: Never  . Drug use: Never   Patient is a Ship broker at [employer]. She is from [place] and was visiting her family in [place] one week prior. Per mother, no history of illicit drug use.   Review of Systems: A complete ROS was negative except as per HPI.   Physical Exam: Blood pressure 110/73, pulse 95, temperature 98.6 F (37 C), temperature source Oral, resp. rate 18, weight 54.4 kg,  SpO2 99 %. Physical Exam Constitutional:      General: She is not in acute distress.    Appearance: Normal appearance. She is not ill-appearing or diaphoretic.  HENT:     Head: Normocephalic and atraumatic.     Mouth/Throat:     Mouth: Mucous membranes are moist.     Pharynx: No oropharyngeal exudate or posterior oropharyngeal erythema.  Eyes:     General: No scleral icterus.    Extraocular Movements: Extraocular movements intact.     Conjunctiva/sclera: Conjunctivae normal.    Cardiovascular:     Rate and Rhythm: Regular rhythm. Tachycardia present.     Pulses: Normal pulses.     Heart sounds: Normal heart sounds. No murmur. No friction rub. No gallop.   Pulmonary:     Effort: Pulmonary effort is normal. No respiratory distress.     Breath sounds: Normal breath sounds. No wheezing, rhonchi or rales.  Abdominal:     General: Abdomen is flat. Bowel sounds are normal. There is no distension.     Palpations: Abdomen is soft.     Tenderness: There is no abdominal tenderness. There is no guarding or rebound.  Musculoskeletal:        General: No swelling, tenderness, deformity or signs of injury. Normal range of motion.     Cervical back: Normal range of motion and neck supple. No rigidity.  Skin:    General: Skin is warm and dry.     Capillary Refill: Capillary refill takes less than 2 seconds.     Findings: No erythema, lesion or rash.  Neurological:     Mental Status: She is disoriented.     Cranial Nerves: No cranial nerve deficit.     Sensory: No sensory deficit.     Motor: No weakness.  Psychiatric:        Attention and Perception: Attention normal.        Mood and Affect: Affect is labile and inappropriate.        Speech: Speech is tangential.        Behavior: Behavior is cooperative.        Thought Content: Thought content is delusional.        Cognition and Memory: She exhibits impaired recent memory.      EKG: personally reviewed my interpretation is sinus tachycardia, Rate 125, mild ST elevation in V4-6  CXR: personally reviewed my interpretation is no active cardiopulmonary disease  CT HEAD WO CONTRAST: IMPRESSION: 1. No acute intracranial abnormality. 2. Large amount of retropharyngeal gas, incompletely visualized. This likely indicates air ascending from pneumomediastinum. CT of the neck is recommended.  CT CHEST W CONTRAST:  IMPRESSION: Extensive pneumomediastinum, without cause identified  CT SOFT TISSUE NECK W CONTRAST:   IMPRESSION: Pneumomediastinum extending into the neck including gas throughout the retropharyngeal space. No fluid collection or foreign body identified in the neck.  ESOPHAGRAM:  IMPRESSION: No esophageal perforation identified. No extraluminal contrast material within the mediastinum.  Assessment & Plan by Problem: [name] is a 19 year old female without any significant past medical history presenting with two days of altered mental status found to have a pneumomediastinum on imaging.   Altered mental status: Patient presented with two days of altered mental status. She was noted to be knocking on doors in her apartment complex and used the bathroom in an apartment that was not her own. In the ED, patient noted to have leukocytosis with elevated lactic acid. CT Head negative for acute intracranial abnormality.  LP without any signs of infectious etiology of patient's AMS. Per discussion with mom, patient is a Consulting civil engineer at [employer] and her last known normal was two days ago. Unclear of precipitating factor for patient's AMS. No history of diagnosed psychiatric illness but patient's mother does note behaviors consistent with bipolar disorder. No history of illicit drug use and UDS negative at this time. Unclear etiology of patient's AMS; however cannot rule out infection. Also suspected psychotic break.  -Continue broad spectrum IV antibiotics - CBC daily - Continue to monitor - Consider psychiatry consult   Pneumomediastinum: Patient noted to have retropharyngeal gas on CT Head for which CT Chest obtained confirming an extensive pneumomediastinum extending into the neck, including gas in the retropharyngeal space. Patient asymptomatic and currently saturating 100% on 2L [place]. Cardiothoracic surgery consulted and obtained esophagram to evaluate for any esophageal perforation given history of possible emesis.  - Cardiothoracic surgery following, appreciate their recommendations   - Continue to monitor with serial CXR - Discontinue IV flagyl at this time, negative for esophageal perforation  Acute kidney injury: Elevated CK: Patient noted to have elevated BUN/Cr 19/1.39 and hypokalemic to 3.1. She is also noted to have elevated CK >2000. Likely pre-renal in setting of dehydration.  - IV fluids - BMP daily  Diet: NPO Fluids: LR 100 cc/hr Electrolytes: Monitor and replete prn   DVT Prophylaxis: Lovenox  Code status: FULL   Dispo: Admit patient to Inpatient with expected length of stay greater than 2 midnights.  Signed: Eliezer Bottom, MD  Internal Medicine, PGY-1 11/14/2019, 2:11 PM  Pager: [phone]

## 2019-11-14 NOTE — ED Notes (Signed)
Unable to complete LP due to PT's condition. See EDP Note

## 2019-11-14 NOTE — Hospital Course (Addendum)
Admitted 11/14/2019  Allergies: Patient has no known allergies. Pertinent Hx: Bipolar  19 y.o. female p/w altered mental status (brought to ED by [employer]  *Sepsis: Presented with AMS, tachycardic 130-140, wbc 34, LA 2.3> 1.4.  Received IVF and Vanco cefepime and metronidazole in ED. LP negative, Covid-19. BC and UC pending. Narrowing to Vanc and ceftriaxon.  *Pneumomediastinum: spontaneous? Large amount of retropharyngeal gas, pneumomediastinum extend superiorly into the neck in both carotid space  reported on CT. Troponin 22>32. EDP talked to CT curgery and ordered esophagogram>negative for perf. Repat Cxr tomorrow.  *Psychosis: No prior Hx. UDS negative.  Can be psychotic break. Consulting psych.   *Hypokalemia at 2.5: replacing  *AKI 1.48: IV fluid. CK elevated at 1200. Giving IV fluid and repeat CK tomorrow  Consults: CTCS, Psych  Meds: Vanc, Ceftriaxon, VTE ppx: Lovenox  IVF: LR 100 ml/h Diet: npo

## 2019-11-14 NOTE — Progress Notes (Signed)
Notified bedside nurse of need to draw blood cultures.  According to chart review blood cultures drawn after abx started. I tried to clarify with the bedside RN if this was accurate and she said that the previous RN had drawn the blood cultures and couldn't answer if abx were started prior to blood cultures drawn.

## 2019-11-14 NOTE — ED Provider Notes (Signed)
.Sedation  Date/Time: 11/14/2019 9:54 AM Performed by: Sherwood Gambler, MD Authorized by: Sherwood Gambler, MD   Consent:    Consent obtained:  Emergent situation Universal protocol:    Procedure explained and questions answered to patient or proxy's satisfaction: yes     Relevant documents present and verified: yes     Test results available and properly labeled: yes     Imaging studies available: yes     Required blood products, implants, devices, and special equipment available: yes     Site/side marked: yes     Immediately prior to procedure a time out was called: yes     Patient identity confirmation method:  Provided demographic data Indications:    Procedure necessitating sedation performed by:  Physician performing sedation Pre-sedation assessment:    Time since last food or drink:  Unknown   NPO status caution: unable to specify NPO status     ASA classification: class 1 - normal, healthy patient     Neck mobility: normal     Mouth opening:  3 or more finger widths   Thyromental distance:  4 finger widths   Mallampati score:  I - soft palate, uvula, fauces, pillars visible   Pre-sedation assessments completed and reviewed: airway patency, cardiovascular function, hydration status, mental status, nausea/vomiting, pain level, respiratory function and temperature   Immediate pre-procedure details:    Reassessment: Patient reassessed immediately prior to procedure     Reviewed: vital signs, relevant labs/tests and NPO status     Verified: bag valve mask available, emergency equipment available, intubation equipment available, IV patency confirmed, oxygen available and suction available   Procedure details (see MAR for exact dosages):    Preoxygenation:  Nasal cannula   Sedation:  Propofol   Intended level of sedation: deep   Intra-procedure monitoring:  Blood pressure monitoring, cardiac monitor, continuous pulse oximetry, frequent LOC assessments, frequent vital sign checks  and continuous capnometry   Intra-procedure events: none     Total Provider sedation time (minutes):  16 Post-procedure details:    Attendance: Constant attendance by certified staff until patient recovered     Recovery: Patient returned to pre-procedure baseline     Post-sedation assessments completed and reviewed: airway patency, cardiovascular function, hydration status, mental status, nausea/vomiting, pain level, respiratory function and temperature     Patient is stable for discharge or admission: yes     Patient tolerance:  Tolerated well, no immediate complications .Lumbar Puncture  Date/Time: 11/14/2019 9:55 AM Performed by: Sherwood Gambler, MD Authorized by: Sherwood Gambler, MD   Consent:    Consent obtained:  Emergent situation Pre-procedure details:    Procedure purpose:  Diagnostic   Preparation: Patient was prepped and draped in usual sterile fashion   Sedation:    Sedation type:  Moderate (conscious) sedation Anesthesia (see MAR for exact dosages):    Anesthesia method:  Local infiltration   Local anesthetic:  Lidocaine 1% w/o epi Procedure details:    Lumbar space:  L4-L5 interspace   Patient position:  R lateral decubitus   Needle gauge:  20   Number of attempts:  2   Fluid appearance:  Blood-tinged then clearing   Tubes of fluid:  4   Total volume (ml):  4 Post-procedure:    Puncture site:  Adhesive bandage applied   Patient tolerance of procedure:  Tolerated well, no immediate complications .Critical Care Performed by: Sherwood Gambler, MD Authorized by: Sherwood Gambler, MD   Critical care provider statement:    Critical  care time (minutes):  30   Critical care time was exclusive of:  Separately billable procedures and treating other patients   Critical care was necessary to treat or prevent imminent or life-threatening deterioration of the following conditions:  CNS failure or compromise   Critical care was time spent personally by me on the following  activities:  Discussions with consultants, evaluation of patient's response to treatment, examination of patient, ordering and performing treatments and interventions, ordering and review of laboratory studies, ordering and review of radiographic studies, pulse oximetry, re-evaluation of patient's condition, obtaining history from patient or surrogate and review of old charts     Patient care transferred to me.  Lumbar puncture performed under emergent consent as when I talked with patient she is currently talking nonsense.  Is altered.  With propofol, CSF was able to be obtained.  I also discussed with Dr. Maren Beach about her pneumomediastinum.  He will order an esophageal study. Will need medical admission  I discussed with internal medicine teaching service. HR has improved. Lactate is improving. Will continue antibiotics, monitoring, fluids. Still need covid testing and urine/uds.   Pricilla Loveless, MD 11/14/19 [phone]

## 2019-11-15 ENCOUNTER — Inpatient Hospital Stay (HOSPITAL_COMMUNITY): Payer: BC Managed Care – PPO

## 2019-11-15 DIAGNOSIS — R7401 Elevation of levels of liver transaminase levels: Secondary | ICD-10-CM

## 2019-11-15 DIAGNOSIS — J982 Interstitial emphysema: Secondary | ICD-10-CM

## 2019-11-15 DIAGNOSIS — R7989 Other specified abnormal findings of blood chemistry: Secondary | ICD-10-CM

## 2019-11-15 DIAGNOSIS — F2081 Schizophreniform disorder: Secondary | ICD-10-CM | POA: Diagnosis present

## 2019-11-15 LAB — COMPREHENSIVE METABOLIC PANEL
ALT: 22 U/L (ref 0–44)
AST: 44 U/L — ABNORMAL HIGH (ref 15–41)
Albumin: 3 g/dL — ABNORMAL LOW (ref 3.5–5.0)
Alkaline Phosphatase: 34 U/L — ABNORMAL LOW (ref 38–126)
Anion gap: 8 (ref 5–15)
BUN: 6 mg/dL (ref 6–20)
CO2: 23 mmol/L (ref 22–32)
Calcium: 8.4 mg/dL — ABNORMAL LOW (ref 8.9–10.3)
Chloride: 108 mmol/L (ref 98–111)
Creatinine, Ser: 0.79 mg/dL (ref 0.44–1.00)
GFR calc Af Amer: >60 mL/min (ref >60)
GFR calc non Af Amer: >60 mL/min (ref >60)
Glucose, Bld: 77 mg/dL (ref 70–99)
Potassium: 3.4 mmol/L — ABNORMAL LOW (ref 3.5–5.1)
Sodium: 139 mmol/L (ref 135–145)
Total Bilirubin: 0.8 mg/dL (ref 0.3–1.2)
Total Protein: 5.6 g/dL — ABNORMAL LOW (ref 6.5–8.1)

## 2019-11-15 LAB — CBC
HCT: 30.4 % — ABNORMAL LOW (ref 36.0–46.0)
Hemoglobin: 10.2 g/dL — ABNORMAL LOW (ref 12.0–15.0)
MCH: 28.8 pg (ref 26.0–34.0)
MCHC: 33.6 g/dL (ref 30.0–36.0)
MCV: 85.9 fL (ref 80.0–100.0)
Platelets: 278 10*3/uL (ref 150–400)
RBC: 3.54 MIL/uL — ABNORMAL LOW (ref 3.87–5.11)
RDW: 12.7 % (ref 11.5–15.5)
WBC: 12 10*3/uL — ABNORMAL HIGH (ref 4.0–10.5)
nRBC: 0 % (ref 0.0–0.2)

## 2019-11-15 LAB — CK: Total CK: 745 U/L — ABNORMAL HIGH (ref 38–234)

## 2019-11-15 LAB — PROTIME-INR
INR: 1.3 — ABNORMAL HIGH (ref 0.8–1.2)
Prothrombin Time: 15.8 seconds — ABNORMAL HIGH (ref 11.4–15.2)

## 2019-11-15 MED ORDER — RISPERIDONE 2 MG PO TABS
2.0000 mg | ORAL_TABLET | Freq: Every day | ORAL | Status: DC
  Administered 2019-11-15 – 2019-11-17 (×3): 2 mg via ORAL
  Filled 2019-11-15 (×4): qty 1

## 2019-11-15 MED ORDER — OMEGA-3-ACID ETHYL ESTERS 1 G PO CAPS
1.0000 g | ORAL_CAPSULE | Freq: Two times a day (BID) | ORAL | Status: DC
  Administered 2019-11-15 – 2019-11-18 (×6): 1 g via ORAL
  Filled 2019-11-15 (×6): qty 1

## 2019-11-15 MED ORDER — BENZTROPINE MESYLATE 0.5 MG PO TABS
0.5000 mg | ORAL_TABLET | Freq: Two times a day (BID) | ORAL | Status: DC
  Administered 2019-11-15 – 2019-11-18 (×6): 0.5 mg via ORAL
  Filled 2019-11-15 (×9): qty 1

## 2019-11-15 MED ORDER — RISPERIDONE 1 MG PO TABS
1.0000 mg | ORAL_TABLET | Freq: Every day | ORAL | Status: DC
  Administered 2019-11-16 – 2019-11-18 (×3): 1 mg via ORAL
  Filled 2019-11-15 (×4): qty 1

## 2019-11-15 MED ORDER — PRENATAL MULTIVITAMIN CH
1.0000 | ORAL_TABLET | Freq: Every day | ORAL | Status: DC
  Administered 2019-11-18: 1 via ORAL
  Filled 2019-11-15 (×4): qty 1

## 2019-11-15 NOTE — Progress Notes (Addendum)
Subjective: HD 1  Overnight no acute events  This morning, patient evaluated at bedside. She is lying comfortably in bed and does not appear to be in any acute distress. She continues to have tangential speech.   Objective:  Vital signs in last 24 hours: Vitals:   11/14/19 1630 11/14/19 1646 11/14/19 1740 11/15/19 0450  BP: 116/80 115/79  (!) 123/95  Pulse:  (!) 105 82 95  Resp: 20 19 17 16   Temp:  98.3 F (36.8 C)  97.9 F (36.6 C)  TempSrc:  Oral  Oral  SpO2:  100% 100% 100%  Weight:  53.4 kg    Height:  5\' 3"  (1.6 m)     CBC Latest Ref Rng & Units 11/15/2019 11/14/2019 11/13/2019  WBC 4.0 - 10.5 K/uL 12.0(H) 24.1(H) 34.4(H)  Hemoglobin 12.0 - 15.0 g/dL 10.2(L) 14.6 15.4(H)  Hematocrit 36.0 - 46.0 % 30.4(L) 42.5 43.1  Platelets 150 - 400 K/uL 278 461(H) 486(H)   CMP Latest Ref Rng & Units 11/15/2019 11/14/2019 11/14/2019  Glucose 70 - 99 mg/dL 77 75 87  BUN 6 - 20 mg/dL 6 8 19   Creatinine 0.44 - 1.00 mg/dL 11/16/2019 11/16/2019 )  Sodium 135 - 145 mmol/L 139 137 132(L)  Potassium 3.5 - 5.1 mmol/L 3.4(L) 3.4(L) 3.1(L)  Chloride 98 - 111 mmol/L 108 106 92(L)  CO2 22 - 32 mmol/L 23 20(L) 21(L)  Calcium 8.9 - 10.3 mg/dL 3.54) 8.1(L) 10.0  Total Protein 6.5 - 8.1 g/dL 5.62) 5.7(L) 9.4(H)  Total Bilirubin 0.3 - 1.2 mg/dL 0.8 1.0 5.63(S)  Alkaline Phos 38 - 126 U/L 34(L) 37(L) 60  AST 15 - 41 U/L 44(H) 44(H) 63(H)  ALT 0 - 44 U/L 22 22 27     Constitutional: young female, no acute distress HEENT: [place],AT; EOMI nl Cardiovascular: RRR, S1 and S2 nl, no m/r/g Respiratory: No respiratory distress, on room air, no wheezing, rhonchi or rales noted GI/Abdomen: soft, nondistended, nontender, normoactive bowel sounds Neurologic: awake, alert and oriented; no obvious focal deficits noted MSK: moving all extremities spontaneously Skin: nondiaphoretic, nonerythematous Psych: continued tangential speech    Assessment/Plan:  [name] is a 19 year old female without any  significant past medical history presenting with two days of altered mental status found to have a pneumomediastinum on imaging.    Schizophreniform Disorder: Patient presented with two days of altered mental status. Initial concerns for sepsis with possible meningitis/encephalitis. However, infectious work up has been negative thus far. UA and UDS negative. CBC with improving leukocytosis, likely reactive. Patient does not have any history of diagnosed psychiatric illness but her mother has reported concerns for bipolar disorder. On examination, patient is alert and oriented; however has persistent delusions of being kidnapped. She has ongoing tangential speech. - Psychiatry consulted, appreciate their recommendations - Bentrozpine 0.5mg  BID - Risperidone 1mg  daily and 2mg  at night - Omega 3 supplement bid - IVC if patient tries to leave AMA as she does not have the capacity to make this decision  - Disposition is to inpatient psychiatry once cleared by CT surgery   Spontaneous pneumomediastinum: Patient noted to have retropharyngeal gas on CT Head for which CT Chest obtained confirming an extensive pneumomediastinum extending into the neck, including gas in the retropharyngeal space. Esophagram negative for any perforation. Patient asymptomatic and currently saturating 100% on room air. CXR this AM without pneumothorax. Will continue with conservative management at this point with analgesia and rest.  - Cardiothoracic surgery following, appreciate their recommendations  -  Discontinue IV antibiotics at this time    Acute kidney injury: Elevated CK: Transaminitis:  Patient noted to have elevated BUN/Cr 19/1.39 and CK> 2000 on presentation in setting of dehydration; improved with IV fluids this AM. Likely pre-renal in setting of dehydration.   - Continue maintenance fluids  - BMP daily   Diet: NPO Fluids: LR 100 cc/hr Electrolytes: Monitor and replete prn    DVT Prophylaxis: Lovenox  Code  status: FULL  Prior to Admission Living Arrangement: Home  Anticipated Discharge Location: Inpatient behavioral health Barriers to Discharge: Continued medical management   Dispo: Anticipated discharge in approximately 1-2 day(s).   Harvie Heck, MD  Internal Medicine, PGY-1 11/15/2019, 6:38 AM Pager: [phone]

## 2019-11-15 NOTE — Consult Note (Addendum)
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  The patient continued to elaborate delusional beliefs, making bizarre statements which have persisted into today's interview and have not resolved. Phone call to her mother confirms no history of substance abuse, no family history of psychiatric illness, and no history of trauma either physical or psychological in this patient.  Subjective:   The patient presented again with the pneumomediastinum, but also persistent and unusual delusional believes.  She describes herself as being "kidnapped" and as recently as last evening, around 11 PM the patient had phoned her mother to report that she was kidnapped and was going to be killed.  When I interviewed the patient she is alert she is oriented to the fact she is in [place] she knows the day and the date.  She whispers during some of the interview and does appear paranoid because of this, she states that the EMS drivers kidnapped her and took her to [place], she asks what I know about [place] and then elaborates that there was a Social worker" and that the EMS workers were going to kill her.  She also states there is much more detail and she is going to write it all down, then she states she wants me to write it all down  as she dictates that, but she begins to talk in circles.  She also does not want the interviewed and, after the mental status exam, after elaborating history, she keeps saying "do not go off got much more to tell you"  The patient denied substance abuse, she denied trauma, her story is consistent with the mother's history in this regard.  The patient, as well as her mother are unaware of her family psychiatric history of bipolar or schizophrenia.   Risk to Self:  moderate/due to delusional believes and unpredictable behavior Risk to Others:   neg Prior Inpatient Therapy:  neg Prior Outpatient Therapy:  neg  Past Medical History: History reviewed. No pertinent past medical history. History reviewed. No pertinent surgical history. Family History: No family history on file. Family Psychiatric  History: neg Social History:  Social History   Substance and Sexual Activity  Alcohol Use Never     Social History   Substance and Sexual Activity  Drug Use Never    Social History   Socioeconomic History  . Marital status: Single    Spouse name: Not on file  . Number of children: Not on file  . Years of education: Not on file  . Highest education level: Not on file  Occupational History  . Not on file  Tobacco Use  . Smoking status: Never Smoker  . Smokeless tobacco: Never Used  Substance and Sexual Activity  . Alcohol use: Never  . Drug use: Never  . Sexual activity: Not on file  Other Topics Concern  . Not on file  Social History  Narrative  . Not on file   Social Determinants of Health   Financial Resource Strain:   . Difficulty of Paying Living Expenses:   Food Insecurity:   . Worried About Programme researcher, broadcasting/film/video in the Last Year:   . Barista in the Last Year:   Transportation Needs:   . Freight forwarder (Medical):   Marland Kitchen Lack of Transportation (Non-Medical):   Physical Activity:   . Days of Exercise per Week:   . Minutes of Exercise per Session:   Stress:    . Feeling of Stress :   Social Connections:   . Frequency of Communication with Friends and Family:   . Frequency of Social Gatherings with Friends and Family:   . Attends Religious Services:   . Active Member of Clubs or Organizations:   . Attends Banker Meetings:   Marland Kitchen Marital Status:    Additional Social History:    Allergies:  No Known Allergies  Labs:  Results for orders placed or performed during the hospital encounter of 11/14/19 (from the past 48 hour(s))  CBG monitoring, ED     Status: None   Collection Time: 11/14/19  3:07 AM  Result Value Ref Range   Glucose-Capillary 70 70 - 99 mg/dL    Comment: Glucose reference range applies only to samples taken after fasting for at least 8 hours.  Comprehensive metabolic panel     Status: Abnormal   Collection Time: 11/14/19  3:12 AM  Result Value Ref Range   Sodium 132 (L) 135 - 145 mmol/L   Potassium 3.1 (L) 3.5 - 5.1 mmol/L   Chloride 92 (L) 98 - 111 mmol/L   CO2 21 (L) 22 - 32 mmol/L   Glucose, Bld 87 70 - 99 mg/dL    Comment: Glucose reference range applies only to samples taken after fasting for at least 8 hours.   BUN 19 6 - 20 mg/dL   Creatinine, Ser 5.63 (H) 0.44 - 1.00 mg/dL   Calcium 87.5 8.9 - 64.3 mg/dL   Total Protein 9.4 (H) 6.5 - 8.1 g/dL   Albumin 5.4 (H) 3.5 - 5.0 g/dL   AST 63 (H) 15 - 41 U/L   ALT 27 0 - 44 U/L   Alkaline Phosphatase 60 38 - 126 U/L   Total Bilirubin 1.9 (H) 0.3 - 1.2 mg/dL   GFR calc non Af Amer 54 (L) >60 mL/min   GFR calc Af Amer >60 >60 mL/min   Anion gap 19 (H) 5 - 15    Comment: Performed at [facility], [address], [place], [place] [address]  CBC     Status: Abnormal   Collection Time: 11/14/19  3:12 AM  Result Value Ref Range   WBC 24.1 (H) 4.0 - 10.5 K/uL   RBC 5.20 (H) 3.87 - 5.11 MIL/uL   Hemoglobin 14.6 12.0 - 15.0 g/dL   HCT 88.4 16.6 - 06.3 %   MCV 81.7 80.0 - 100.0 fL   MCH 28.1 26.0 - 34.0 pg   MCHC 34.4 30.0 - 36.0 g/dL   RDW 01.6 01.0 -  93.2 %   Platelets 461 (H) 150 - 400 K/uL   nRBC 0.0 0.0 - 0.2 %    Comment: Performed at [facility], [address], [place], [place] [address]  Salicylate level     Status: Abnormal   Collection Time: 11/14/19  3:14 AM  Result Value Ref Range   Salicylate  Lvl <7.0 (L) 7.0 - 30.0 mg/dL    Comment: Performed at [facility], [address] [place], [place] [address]  Acetaminophen level     Status: Abnormal   Collection Time: 11/14/19  3:14 AM  Result Value Ref Range   Acetaminophen (Tylenol), Serum <10 (L) 10 - 30 ug/mL    Comment: (NOTE) Therapeutic concentrations vary significantly. A range of 10-30 ug/mL  may be an effective concentration for many patients. However, some  are best treated at concentrations outside of this range. Acetaminophen concentrations >150 ug/mL at 4 hours after ingestion  and >50 ug/mL at 12 hours after ingestion are often associated with  toxic reactions. Performed at [facility], [address], [place], [place] [address]   Magnesium     Status: None   Collection Time: 11/14/19  3:14 AM  Result Value Ref Range   Magnesium 2.3 1.7 - 2.4 mg/dL    Comment: Performed at [facility], [address] [place], [place] [address]  I-Stat beta hCG blood, ED     Status: None   Collection Time: 11/14/19  3:26 AM  Result Value Ref Range   I-stat hCG, quantitative <5.0 <5 mIU/mL   Comment 3            Comment:   GEST. AGE      CONC.  (mIU/mL)   <=1 WEEK        5 - 50     2 WEEKS       50 - 500     3 WEEKS       100 - 10,000     4 WEEKS     1,000 - 30,000        FEMALE AND NON-PREGNANT FEMALE:     LESS THAN 5 mIU/mL   Troponin I (High Sensitivity)     Status: Abnormal   Collection Time: 11/14/19  5:04 AM  Result Value Ref Range   Troponin I (High Sensitivity) 32 (H) <18 ng/L    Comment: (NOTE) Elevated high sensitivity troponin I (hsTnI) values and significant  changes across serial measurements may suggest ACS but many other   chronic and acute conditions are known to elevate hsTnI results.  Refer to the "Links" section for chest pain algorithms and additional  guidance. Performed at [facility], [address] [place], [place] [address]   CK     Status: Abnormal   Collection Time: 11/14/19  5:04 AM  Result Value Ref Range   Total CK 2,251 (H) 38 - 234 U/L    Comment: Performed at [facility], [address] [place], [place] [address]  Lactic acid, plasma     Status: Abnormal   Collection Time: 11/14/19  5:15 AM  Result Value Ref Range   Lactic Acid, Venous 2.3 (HH) 0.5 - 1.9 mmol/L    Comment: CRITICAL RESULT CALLED TO, READ BACK BY AND VERIFIED WITH: C.PIECKERT RN @ [phone] 11/14/2019 BY C.EDENS Performed at [facility], [address], [place], [place] [address]   Lactic acid, plasma     Status: None   Collection Time: 11/14/19  7:08 AM  Result Value Ref Range   Lactic Acid, Venous 1.4 0.5 - 1.9 mmol/L    Comment: Performed at [facility], [address], [place], [place] [address]  Blood Culture (routine x 2)     Status: None (Preliminary result)   Collection Time: 11/14/19  7:08 AM   Specimen: BLOOD  Result Value Ref Range   Specimen Description BLOOD RIGHT ANTECUBITAL    Special Requests      BOTTLES DRAWN AEROBIC AND ANAEROBIC Blood Culture results may not be optimal due to an inadequate volume of blood received in culture bottles   Culture      NO GROWTH 1 DAY Performed at [facility], [address] [place], [place] [address]    Report Status PENDING   Blood Culture (routine x 2)     Status: None (Preliminary result)   Collection Time: 11/14/19  7:11 AM   Specimen: BLOOD  Result Value Ref Range   Specimen Description BLOOD SITE NOT SPECIFIED    Special Requests      BOTTLES DRAWN AEROBIC ONLY Blood Culture adequate volume   Culture      NO GROWTH 1 DAY Performed at [facility], [address] [place], [place] [address]    Report Status  PENDING   CSF culture     Status: None (Preliminary result)   Collection Time: 11/14/19  8:27 AM   Specimen: CSF; Cerebrospinal Fluid  Result Value Ref Range   Specimen Description CSF    Special Requests LUMBAR PUNCTURE    Gram Stain      NO WBC SEEN NO ORGANISMS SEEN CYTOSPIN SMEAR Performed at [facility] Lab, [address], [place], [place] [address]    Culture PENDING    Report Status PENDING   CSF cell count with differential collection tube #: 1     Status: Abnormal   Collection Time: 11/14/19 10:02 AM  Result Value Ref Range   Tube # 1    Color, CSF PINK (A) COLORLESS   Appearance, CSF HAZY (A) CLEAR   Supernatant COLORLESS    RBC Count, CSF 2,090 (H) 0 /cu mm   WBC, CSF 2 0 - 5 /cu mm   Segmented Neutrophils-CSF TOO FEW TO COUNT, SMEAR AVAILABLE FOR REVIEW 0 - 6 %   Lymphs, CSF TOO FEW TO COUNT, SMEAR AVAILABLE FOR REVIEW 40 - 80 %   Monocyte-Macrophage-Spinal Fluid TOO FEW TO COUNT, SMEAR AVAILABLE FOR REVIEW 15 - 45 %   Eosinophils, CSF TOO FEW TO COUNT, SMEAR AVAILABLE FOR REVIEW 0 - 1 %   Other Cells, CSF FEW NEUTROPHILS     Comment: RARE LYMPHOCYTES AND MONOCYTES/MACROPHAGES Performed at [facility], [address], [place], [place] [address]   CSF cell count with differential     Status: Abnormal   Collection Time: 11/14/19 10:03 AM  Result Value Ref Range   Tube # 4    Color, CSF COLORLESS COLORLESS   Appearance, CSF CLEAR (A) CLEAR   Supernatant NOT INDICATED    RBC Count, CSF 66 (H) 0 /cu mm   WBC, CSF 0 0 - 5 /cu mm   Segmented Neutrophils-CSF TOO FEW TO COUNT, SMEAR AVAILABLE FOR REVIEW 0 - 6 %   Lymphs, CSF TOO FEW TO COUNT, SMEAR AVAILABLE FOR REVIEW 40 - 80 %   Monocyte-Macrophage-Spinal Fluid TOO FEW TO COUNT, SMEAR AVAILABLE FOR REVIEW 15 - 45 %   Eosinophils, CSF TOO FEW TO COUNT, SMEAR AVAILABLE FOR REVIEW 0 - 1 %   Other Cells, CSF FEW LYMPHOCYTES AND MONOCYTES/MACROPHAGES     Comment: RARE NEUTROPHILS Performed at [facility], [address], [place], [place] [address]   Protein and glucose, CSF     Status: None  Collection Time: 11/14/19 10:03 AM  Result Value Ref Range   Glucose, CSF 65 40 - 70 mg/dL   Total  Protein, CSF 22 15 - 45 mg/dL    Comment: Performed at [place] [address] [place], [place] [address]  Respiratory Panel by RT PCR (Flu A&B, Covid) - Nasopharyngeal Swab     Status: None   Collection Time: 11/14/19 10:44 AM   Specimen: Nasopharyngeal Swab  Result Value Ref Range   SARS Coronavirus 2 by RT PCR NEGATIVE NEGATIVE    Comment: (NOTE) SARS-CoV-2 target nucleic acids are NOT DETECTED. The SARS-CoV-2 RNA is generally detectable in upper respiratoy specimens during the acute phase of infection. The lowest concentration of SARS-CoV-2 viral copies this assay can detect is 131 copies/mL. A negative result does not preclude SARS-Cov-2 infection and should not be used as the sole basis for treatment or other patient management decisions. A negative result may occur with  improper specimen collection/handling, submission of specimen other than nasopharyngeal swab, presence of viral mutation(s) within the areas targeted by this assay, and inadequate number of viral copies (<131 copies/mL). A negative result must be combined with clinical observations, patient history, and epidemiological information. The expected result is Negative. Fact Sheet for Patients:  PinkCheek.be Fact Sheet for Healthcare Providers:  GravelBags.it This test is not yet ap proved or cleared by the [place] FDA and  has been authorized for detection and/or diagnosis of SARS-CoV-2 by FDA under an Emergency Use Authorization (EUA). This EUA will remain  in effect (meaning this test can be used) for the duration of the COVID-19 declaration under Section 564(b)(1) of the Act, 21 U.S.C. section 360bbb-3(b)(1), unless the authorization is  terminated or revoked sooner.    Influenza A by PCR NEGATIVE NEGATIVE   Influenza B by PCR NEGATIVE NEGATIVE    Comment: (NOTE) The Xpert Xpress SARS-CoV-2/FLU/RSV assay is intended as an aid in  the diagnosis of influenza from Nasopharyngeal swab specimens and  should not be used as a sole basis for treatment. Nasal washings and  aspirates are unacceptable for Xpert Xpress SARS-CoV-2/FLU/RSV  testing. Fact Sheet for Patients: PinkCheek.be Fact Sheet for Healthcare Providers: GravelBags.it This test is not yet approved or cleared by the [place] FDA and  has been authorized for detection and/or diagnosis of SARS-CoV-2 by  FDA under an Emergency Use Authorization (EUA). This EUA will remain  in effect (meaning this test can be used) for the duration of the  Covid-19 declaration under Section 564(b)(1) of the Act, 21  U.S.C. section 360bbb-3(b)(1), unless the authorization is  terminated or revoked. Performed at [facility], [address], [place], [place] [address]   Urinalysis, Routine w reflex microscopic     Status: Abnormal   Collection Time: 11/14/19 11:58 AM  Result Value Ref Range   Color, Urine YELLOW YELLOW   APPearance HAZY (A) CLEAR   Specific Gravity, Urine 1.026 1.005 - 1.030   pH 6.0 5.0 - 8.0   Glucose, UA NEGATIVE NEGATIVE mg/dL   Hgb urine dipstick SMALL (A) NEGATIVE   Bilirubin Urine NEGATIVE NEGATIVE   Ketones, ur 20 (A) NEGATIVE mg/dL   Protein, ur NEGATIVE NEGATIVE mg/dL   Nitrite NEGATIVE NEGATIVE   Leukocytes,Ua SMALL (A) NEGATIVE   RBC / HPF 0-5 0 - 5 RBC/hpf   WBC, UA 0-5 0 - 5 WBC/hpf   Bacteria, UA RARE (A) NONE SEEN   Squamous Epithelial / LPF 0-5 0 - 5  Mucus PRESENT     Comment: Performed at [facility], [address], [place], [place] [address]  Rapid urine drug screen (hospital performed)     Status: None   Collection Time: 11/14/19 11:58 AM  Result Value Ref  Range   Opiates NONE DETECTED NONE DETECTED   Cocaine NONE DETECTED NONE DETECTED   Benzodiazepines NONE DETECTED NONE DETECTED   Amphetamines NONE DETECTED NONE DETECTED   Tetrahydrocannabinol NONE DETECTED NONE DETECTED   Barbiturates NONE DETECTED NONE DETECTED    Comment: (NOTE) DRUG SCREEN FOR MEDICAL PURPOSES ONLY.  IF CONFIRMATION IS NEEDED FOR ANY PURPOSE, NOTIFY LAB WITHIN 5 DAYS. LOWEST DETECTABLE LIMITS FOR URINE DRUG SCREEN Drug Class                     Cutoff (ng/mL) Amphetamine and metabolites    1000 Barbiturate and metabolites    200 Benzodiazepine                 200 Tricyclics and metabolites     300 Opiates and metabolites        300 Cocaine and metabolites        300 THC                            50 Performed at [facility] Lab, [address], [place], [place] [address]   HIV Antibody (routine testing w rflx)     Status: None   Collection Time: 11/14/19  5:10 PM  Result Value Ref Range   HIV Screen 4th Generation wRfx NON REACTIVE NON REACTIVE    Comment: Performed at [facility], [address] [place], [place] [address]  Troponin I (High Sensitivity)     Status: Abnormal   Collection Time: 11/14/19  5:10 PM  Result Value Ref Range   Troponin I (High Sensitivity) 23 (H) <18 ng/L    Comment: (NOTE) Elevated high sensitivity troponin I (hsTnI) values and significant  changes across serial measurements may suggest ACS but many other  chronic and acute conditions are known to elevate hsTnI results.  Refer to the "Links" section for chest pain algorithms and additional  guidance. Performed at [facility], [address] [place], [place] [address]   Comprehensive metabolic panel     Status: Abnormal   Collection Time: 11/14/19  7:10 PM  Result Value Ref Range   Sodium 137 135 - 145 mmol/L   Potassium 3.4 (L) 3.5 - 5.1 mmol/L   Chloride 106 98 - 111 mmol/L   CO2 20 (L) 22 - 32 mmol/L   Glucose, Bld 75 70 - 99 mg/dL    Comment:  Glucose reference range applies only to samples taken after fasting for at least 8 hours.   BUN 8 6 - 20 mg/dL   Creatinine, Ser 2.95 0.44 - 1.00 mg/dL   Calcium 8.1 (L) 8.9 - 10.3 mg/dL   Total Protein 5.7 (L) 6.5 - 8.1 g/dL   Albumin 3.1 (L) 3.5 - 5.0 g/dL   AST 44 (H) 15 - 41 U/L   ALT 22 0 - 44 U/L   Alkaline Phosphatase 37 (L) 38 - 126 U/L   Total Bilirubin 1.0 0.3 - 1.2 mg/dL   GFR calc non Af Amer >60 >60 mL/min   GFR calc Af Amer >60 >60 mL/min   Anion gap 11 5 - 15    Comment: Performed at  [facility], [address], [place], [place] [address]  Comprehensive metabolic panel     Status: Abnormal   Collection Time: 11/15/19  3:05 AM  Result Value Ref Range   Sodium 139 135 - 145 mmol/L   Potassium 3.4 (L) 3.5 - 5.1 mmol/L   Chloride 108 98 - 111 mmol/L   CO2 23 22 - 32 mmol/L   Glucose, Bld 77 70 - 99 mg/dL    Comment: Glucose reference range applies only to samples taken after fasting for at least 8 hours.   BUN 6 6 - 20 mg/dL   Creatinine, Ser 6.04 0.44 - 1.00 mg/dL   Calcium 8.4 (L) 8.9 - 10.3 mg/dL   Total Protein 5.6 (L) 6.5 - 8.1 g/dL   Albumin 3.0 (L) 3.5 - 5.0 g/dL   AST 44 (H) 15 - 41 U/L   ALT 22 0 - 44 U/L   Alkaline Phosphatase 34 (L) 38 - 126 U/L   Total Bilirubin 0.8 0.3 - 1.2 mg/dL   GFR calc non Af Amer >60 >60 mL/min   GFR calc Af Amer >60 >60 mL/min   Anion gap 8 5 - 15    Comment: Performed at [facility], [address], [place], [place] [address]  CBC     Status: Abnormal   Collection Time: 11/15/19  3:05 AM  Result Value Ref Range   WBC 12.0 (H) 4.0 - 10.5 K/uL   RBC 3.54 (L) 3.87 - 5.11 MIL/uL   Hemoglobin 10.2 (L) 12.0 - 15.0 g/dL    Comment: REPEATED TO VERIFY   HCT 30.4 (L) 36.0 - 46.0 %   MCV 85.9 80.0 - 100.0 fL   MCH 28.8 26.0 - 34.0 pg   MCHC 33.6 30.0 - 36.0 g/dL   RDW 11.9 14.7 - 82.9 %   Platelets 278 150 - 400 K/uL   nRBC 0.0 0.0 - 0.2 %    Comment: Performed at [facility], [address]  [place], [place] [address]  Protime-INR     Status: Abnormal   Collection Time: 11/15/19  3:05 AM  Result Value Ref Range   Prothrombin Time 15.8 (H) 11.4 - 15.2 seconds   INR 1.3 (H) 0.8 - 1.2    Comment: (NOTE) INR goal varies based on device and disease states. Performed at [facility] Lab, [address], [place], [place] [address]   CK     Status: Abnormal   Collection Time: 11/15/19  3:05 AM  Result Value Ref Range   Total CK 745 (H) 38 - 234 U/L    Comment: Performed at [facility] Lab, [address], [place], [place] [address]    Current Facility-Administered Medications  Medication Dose Route Frequency Provider Last Rate Last Admin  . acetaminophen (TYLENOL) tablet 650 mg  650 mg Oral Q6H PRN Masoudi, Elhamalsadat, MD       Or  . acetaminophen (TYLENOL) suppository 650 mg  650 mg Rectal Q6H PRN Masoudi, Elhamalsadat, MD      . cefTRIAXone (ROCEPHIN) 2 g in sodium chloride 0.9 % 100 mL IVPB  2 g Intravenous Q12H Daylene Posey, RPH 200 mL/hr at 11/14/19 2210 2 g at 11/14/19 2210  . enoxaparin (LOVENOX) injection 40 mg  40 mg Subcutaneous Q24H Aslam, Sadia, MD   40 mg at 11/14/19 1735  . lactated ringers infusion   Intravenous Continuous Eliezer Bottom, MD 100 mL/hr at 11/15/19 0528 New Bag at 11/15/19 0528  .  ondansetron (ZOFRAN) tablet 4 mg  4 mg Oral Q6H PRN Masoudi, Elhamalsadat, MD       Or  . ondansetron (ZOFRAN) injection 4 mg  4 mg Intravenous Q6H PRN Masoudi, Elhamalsadat, MD      . sodium chloride flush (NS) 0.9 % injection 3 mL  3 mL Intravenous Q12H Masoudi, Elhamalsadat, MD      . vancomycin (VANCOREADY) IVPB 750 mg/150 mL  750 mg Intravenous Q12H Tyson AliasVincent, Duncan Thomas, MD 150 mL/hr at 11/14/19 2251 750 mg at 11/14/19 2251    Musculoskeletal: Strength & Muscle Tone: within normal limits Gait & Station: normal Patient leans: N/A  Psychiatric Specialty Exam: Physical Exam  Review of Systems  Blood pressure (!) 123/95, pulse 95, temperature 97.9 F (36.6  C), temperature source Oral, resp. rate 16, height 5\' 3"  (1.6 m), weight 53.4 kg, SpO2 100 %.Body mass index is 20.85 kg/m.  General Appearance: Casual  Eye Contact:  Good  Speech:  Clear and Coherent  Volume:  Decreased  Mood:  Euthymic  Affect:  Congruent  Thought Process:  Irrelevant and Descriptions of Associations: Circumstantial  Orientation:  Full (Time, Place, and Person)  Thought Content:  Illogical, Delusions and Paranoid Ideation denies auditory or visual hallucinations however keeps insisting she is going to be killed by EMS personnel  Suicidal Thoughts:  No  Homicidal Thoughts:  No  Memory:  Immediate;   Poor Recent;   Fair Remote;   Fair  Judgement:  Impaired  Insight:  Lacking  Psychomotor Activity:  Normal  Concentration:  Concentration: Fair and Attention Span: Fair  Recall:  FiservFair  Fund of Knowledge:  Fair  Language:  Fair  Akathisia:  Negative  Handed:  Right  AIMS (if indicated):     Assets:  Manufacturing systems engineerCommunication Skills Housing Leisure Time Physical Health Resilience Social Support Vocational/Educational  ADL's:  Intact  Cognition:  WNL  Sleep:      Assessment Clinical presentation most consistent with new onset schizophreniform disorder (incipient schizophrenia)  Spoke with mother regarding the following treatment plan Begin omega-3's and B vitamins as they may reduce the chances of conversion to permanent psychosis Begin low-dose antipsychotic therapy  Patient should benefit from psychiatry transfer when medically ready, will notify our assessment counselors and have them notify your social worker regarding bed availability Disposition: Recommend psychiatric Inpatient admission when medically cleared.  Malvin JohnsFARAH,Kiasia Chou, MD 11/15/2019 9:15 AM

## 2019-11-15 NOTE — Plan of Care (Signed)
  Problem: Health Behavior/Discharge Planning: Goal: Ability to manage health-related needs will improve Outcome: Not Progressing   Problem: Safety: Goal: Ability to remain free from injury will improve Outcome: Not Progressing   

## 2019-11-16 DIAGNOSIS — M6282 Rhabdomyolysis: Secondary | ICD-10-CM

## 2019-11-16 LAB — COMPREHENSIVE METABOLIC PANEL
ALT: 24 U/L (ref 0–44)
AST: 43 U/L — ABNORMAL HIGH (ref 15–41)
Albumin: 2.8 g/dL — ABNORMAL LOW (ref 3.5–5.0)
Alkaline Phosphatase: 35 U/L — ABNORMAL LOW (ref 38–126)
Anion gap: 15 (ref 5–15)
BUN: 6 mg/dL (ref 6–20)
CO2: 20 mmol/L — ABNORMAL LOW (ref 22–32)
Calcium: 8.3 mg/dL — ABNORMAL LOW (ref 8.9–10.3)
Chloride: 100 mmol/L (ref 98–111)
Creatinine, Ser: 0.73 mg/dL (ref 0.44–1.00)
GFR calc Af Amer: >60 mL/min (ref >60)
GFR calc non Af Amer: >60 mL/min (ref >60)
Glucose, Bld: 62 mg/dL — ABNORMAL LOW (ref 70–99)
Potassium: 2.9 mmol/L — ABNORMAL LOW (ref 3.5–5.1)
Sodium: 135 mmol/L (ref 135–145)
Total Bilirubin: 0.8 mg/dL (ref 0.3–1.2)
Total Protein: 5.4 g/dL — ABNORMAL LOW (ref 6.5–8.1)

## 2019-11-16 LAB — MAGNESIUM: Magnesium: 1.7 mg/dL (ref 1.7–2.4)

## 2019-11-16 LAB — CBC
HCT: 31.9 % — ABNORMAL LOW (ref 36.0–46.0)
Hemoglobin: 10.5 g/dL — ABNORMAL LOW (ref 12.0–15.0)
MCH: 28.3 pg (ref 26.0–34.0)
MCHC: 32.9 g/dL (ref 30.0–36.0)
MCV: 86 fL (ref 80.0–100.0)
Platelets: 329 10*3/uL (ref 150–400)
RBC: 3.71 MIL/uL — ABNORMAL LOW (ref 3.87–5.11)
RDW: 12.7 % (ref 11.5–15.5)
WBC: 9 10*3/uL (ref 4.0–10.5)
nRBC: 0 % (ref 0.0–0.2)

## 2019-11-16 LAB — BASIC METABOLIC PANEL
Anion gap: 15 (ref 5–15)
BUN: 6 mg/dL (ref 6–20)
CO2: 21 mmol/L — ABNORMAL LOW (ref 22–32)
Calcium: 8.3 mg/dL — ABNORMAL LOW (ref 8.9–10.3)
Chloride: 100 mmol/L (ref 98–111)
Creatinine, Ser: 0.72 mg/dL (ref 0.44–1.00)
GFR calc Af Amer: >60 mL/min (ref >60)
GFR calc non Af Amer: >60 mL/min (ref >60)
Glucose, Bld: 59 mg/dL — ABNORMAL LOW (ref 70–99)
Potassium: 3.7 mmol/L (ref 3.5–5.1)
Sodium: 136 mmol/L (ref 135–145)

## 2019-11-16 LAB — URINE CULTURE

## 2019-11-16 LAB — CK: Total CK: 582 U/L — ABNORMAL HIGH (ref 38–234)

## 2019-11-16 MED ORDER — POTASSIUM CHLORIDE 10 MEQ/100ML IV SOLN
10.0000 meq | INTRAVENOUS | Status: AC
  Administered 2019-11-16 (×4): 10 meq via INTRAVENOUS
  Filled 2019-11-16 (×4): qty 100

## 2019-11-16 NOTE — Progress Notes (Signed)
Patient very anxious this morning, worried about her belongings that were left in an "abdonded apartment." Spoke with patient mom on the phone, she states she will try to get here possibly tomorrow, she has to find arrangements for her younger children. Mom would also like to talk with the Doctors, states she only spoke with them briefly.

## 2019-11-16 NOTE — Progress Notes (Signed)
CRITICAL VALUE STICKER  CRITICAL VALUE: Potassium 2.9  RECEIVER (on-site recipient of call):   DATE & TIME NOTIFIED:   MESSENGER (representative from lab):  MD NOTIFIED: Darl Pikes   TIME OF NOTIFICATION: 0510  RESPONSE: 4 runs of potassium

## 2019-11-16 NOTE — Progress Notes (Addendum)
Subjective: HD 2 Overnight no acute events  This morning, patient evaluated at bedside. Pt was sleeping when we entered the room. Pt denies having any pain at this time. She is alert and oriented. She states that she doesn't remember much about what brought her to the hospital, but that she just woke up here.  She does remember being treated by Dr. Evette Doffing the other day. She denies any visual or auditory hallucinations at this time.   Objective:  Vital signs in last 24 hours: Vitals:   11/15/19 1955 11/15/19 2043 11/15/19 2332 11/16/19 0357  BP: 116/79  118/70 108/84  Pulse: 76  80 62  Resp: 18 16 15 15   Temp: 98.7 F (37.1 C)  98.2 F (36.8 C) 98.5 F (36.9 C)  TempSrc: Oral  Oral Oral  SpO2: 100% 100% 100% 100%  Weight:      Height:       CBC Latest Ref Rng & Units 11/16/2019 11/15/2019 11/14/2019  WBC 4.0 - 10.5 K/uL 9.0 12.0(H) 24.1(H)  Hemoglobin 12.0 - 15.0 g/dL 10.5(L) 10.2(L) 14.6  Hematocrit 36.0 - 46.0 % 31.9(L) 30.4(L) 42.5  Platelets 150 - 400 K/uL 329 278 461(H)   CMP Latest Ref Rng & Units 11/16/2019 11/15/2019 11/14/2019  Glucose 70 - 99 mg/dL 62(L) 77 75  BUN 6 - 20 mg/dL 6 6 8   Creatinine 0.44 - 1.00 mg/dL 0.73 0.79 0.81  Sodium 135 - 145 mmol/L 135 139 137  Potassium 3.5 - 5.1 mmol/L 2.9(L) 3.4(L) 3.4(L)  Chloride 98 - 111 mmol/L 100 108 106  CO2 22 - 32 mmol/L 20(L) 23 20(L)  Calcium 8.9 - 10.3 mg/dL 8.3(L) 8.4(L) 8.1(L)  Total Protein 6.5 - 8.1 g/dL 5.4(L) 5.6(L) 5.7(L)  Total Bilirubin 0.3 - 1.2 mg/dL 0.8 0.8 1.0  Alkaline Phos 38 - 126 U/L 35(L) 34(L) 37(L)  AST 15 - 41 U/L 43(H) 44(H) 44(H)  ALT 0 - 44 U/L 24 22 22     Constitutional: young female, no acute distress HEENT: [place],AT; EOMI nl Cardiovascular: RRR, S1 and S2 nl, no m/r/g Respiratory: No respiratory distress, on room air, no wheezing, rhonchi or rales noted GI/Abdomen: soft, nondistended, nontender, normoactive bowel sounds Neurologic: awake, alert and oriented; no obvious focal deficits  noted MSK: moving all extremities spontaneously Skin: nondiaphoretic, nonerythematous Psych: alert, coherent, no visual or auditory hallucination   Assessment/Plan:  [name] is a 19 year old female without any significant past medical history presenting with two days of altered mental status found to have a pneumomediastinum on imaging.    Schizophreniform Disorder: Patient presented with two days of altered mental status. Initial concerns for sepsis with possible meningitis/encephalitis. However, infectious work up has been negative thus far. UA and UDS negative. Leukocytosis resolved.  Patient does not have any history of diagnosed psychiatric illness but her mother has reported concerns for bipolar disorder. On examination, patient is alert and oriented; denies any visual or auditory hallucinations. She reports not eating anything since Easter. She notes that she went home to [place] for [place] one week ago and returned to [place] a few days ago. She does not recall the circumstances surrounding her hospitalization at this time. - Psychiatry consulted, appreciate their recommendations - Bentrozpine 0.5mg  BID - Risperidone 1mg  daily and 2mg  at night - Omega 3 supplement bid - IVC if patient tries to leave AMA as she does not have the capacity to make this decision    Spontaneous pneumomediastinum: Patient noted to have retropharyngeal gas  on CT Head for which CT Chest obtained confirming an extensive pneumomediastinum extending into the neck, including gas in the retropharyngeal space. Esophagram negative for any perforation. Patient asymptomatic and currently saturating 100% on room air. CXR yesterday without pneumothorax. Will continue with conservative management at this point with analgesia and rest.  Discussed with CTS, patient is good to start diet at this point and stable for discharge to inpatient psychiatry.  - Cardiothoracic surgery following, appreciate their  recommendations  - Will start diet today  Rhabdomyolysis:  Hypokalemia: Patient noted to have elevated BUN/Cr 19/1.39 and CK> 2000 on presentation in setting of dehydration; improved with IV fluids. Likely pre-renal in setting of dehydration.   K 2.9 this AM, replaced. - Continue maintenance fluids  - BMP daily   Diet: Regular Fluids: LR 100 cc/hr Electrolytes: Monitor and replete prn    DVT Prophylaxis: Lovenox  Code status: FULL  Prior to Admission Living Arrangement: Home  Anticipated Discharge Location: Inpatient behavioral health  Barriers to Discharge: Pending admit to inpatient behavioral health   Dispo: Anticipated discharge in approximately 1-2 day(s).   Eliezer Bottom, MD  Internal Medicine, PGY-1 11/16/2019, 6:50 AM Pager: [phone]

## 2019-11-17 DIAGNOSIS — R000 Tachycardia, unspecified: Secondary | ICD-10-CM

## 2019-11-17 LAB — COMPREHENSIVE METABOLIC PANEL
ALT: 28 U/L (ref 0–44)
AST: 49 U/L — ABNORMAL HIGH (ref 15–41)
Albumin: 3.2 g/dL — ABNORMAL LOW (ref 3.5–5.0)
Alkaline Phosphatase: 39 U/L (ref 38–126)
Anion gap: 8 (ref 5–15)
BUN: 6 mg/dL (ref 6–20)
CO2: 26 mmol/L (ref 22–32)
Calcium: 8.6 mg/dL — ABNORMAL LOW (ref 8.9–10.3)
Chloride: 104 mmol/L (ref 98–111)
Creatinine, Ser: 0.76 mg/dL (ref 0.44–1.00)
GFR calc Af Amer: >60 mL/min (ref >60)
GFR calc non Af Amer: >60 mL/min (ref >60)
Glucose, Bld: 117 mg/dL — ABNORMAL HIGH (ref 70–99)
Potassium: 3.5 mmol/L (ref 3.5–5.1)
Sodium: 138 mmol/L (ref 135–145)
Total Bilirubin: 0.6 mg/dL (ref 0.3–1.2)
Total Protein: 6.2 g/dL — ABNORMAL LOW (ref 6.5–8.1)

## 2019-11-17 LAB — CSF CULTURE W GRAM STAIN
Culture: NO GROWTH
Gram Stain: NONE SEEN

## 2019-11-17 LAB — CBC
HCT: 37.2 % (ref 36.0–46.0)
Hemoglobin: 12.4 g/dL (ref 12.0–15.0)
MCH: 28.5 pg (ref 26.0–34.0)
MCHC: 33.3 g/dL (ref 30.0–36.0)
MCV: 85.5 fL (ref 80.0–100.0)
Platelets: 401 10*3/uL — ABNORMAL HIGH (ref 150–400)
RBC: 4.35 MIL/uL (ref 3.87–5.11)
RDW: 12.5 % (ref 11.5–15.5)
WBC: 9.2 10*3/uL (ref 4.0–10.5)
nRBC: 0 % (ref 0.0–0.2)

## 2019-11-17 LAB — VDRL, CSF: VDRL Quant, CSF: NONREACTIVE

## 2019-11-17 NOTE — Progress Notes (Signed)
Patient asked to speak with the chaplin during the night. Susan Morgan was notified and stated he would try to get to her by morning. Also, asked if she could be tested for STD's, will pass on to day shift nurse.

## 2019-11-17 NOTE — Progress Notes (Signed)
   11/17/19 1045  Clinical Encounter Type  Visited With Patient  Visit Type Initial  Referral From Nurse  Consult/Referral To Chaplain  Spiritual Encounters  Spiritual Needs Sacred text;Prayer  Stress Factors  Patient Stress Factors None identified  Family Stress Factors None identified   Chaplain responded to consult for support. KK was pleasant and receptive of Chaplain. Chaplain learned that KK grew up in the [place] faith and now identifies as Jewish. We discussed ideals around lives of faith, religious and [place] traditions, and how our lives are all connected with the life of Christ. KK spoke beautifully the names of God in arabic and hebrew. KK has a love for cultural histories and can recite stories, scriptures, and timelines with precision and ease. Chaplain will return with bible, journal, and a prayer shawl for KK to have. KK also loves smiley faces which she plans to use often in her future work as a Runner, broadcasting/film/video. Chaplains remain available for support as needs arise.   Chaplain Resident, Amado Coe, M Div [phone] on-call pager

## 2019-11-17 NOTE — Progress Notes (Signed)
Subjective: HD 3 Overnight no acute events  This morning, patient evaluated at bedside. Patient noted to have tachycardia with HR 130s while sleeping. Patient stated that she had a good night and ate her breakfast this morning. She denies any chest pain or dyspnea.  Patient requesting STD testing. Denies any sexual activity since February and any current vaginal discharge or malodor. Discussed that this would be more appropriate to follow up on as outpatient. Patient expresses understanding and is agreeable with the plan.   Objective: Vital signs in last 24 hours: Vitals:   11/16/19 1236 11/16/19 1716 11/16/19 2232 11/17/19 0508  BP: 119/86 128/83 113/83 117/76  Pulse: 94 88 87 93  Resp: 18 12 17 17   Temp: 98.3 F (36.8 C) 98.4 F (36.9 C) 98.4 F (36.9 C) 97.8 F (36.6 C)  TempSrc: Oral Oral Oral Oral  SpO2: 100% 100% 99% 100%  Weight:      Height:       CBC Latest Ref Rng & Units 11/17/2019 11/16/2019 11/15/2019  WBC 4.0 - 10.5 K/uL 9.2 9.0 12.0(H)  Hemoglobin 12.0 - 15.0 g/dL 12.4 10.5(L) 10.2(L)  Hematocrit 36.0 - 46.0 % 37.2 31.9(L) 30.4(L)  Platelets 150 - 400 K/uL 401(H) 329 278   CMP Latest Ref Rng & Units 11/17/2019 11/16/2019 11/16/2019  Glucose 70 - 99 mg/dL 117(H) 59(L) 62(L)  BUN 6 - 20 mg/dL 6 6 6   Creatinine 0.44 - 1.00 mg/dL 0.76 0.72 0.73  Sodium 135 - 145 mmol/L 138 136 135  Potassium 3.5 - 5.1 mmol/L 3.5 3.7 2.9(L)  Chloride 98 - 111 mmol/L 104 100 100  CO2 22 - 32 mmol/L 26 21(L) 20(L)  Calcium 8.9 - 10.3 mg/dL 8.6(L) 8.3(L) 8.3(L)  Total Protein 6.5 - 8.1 g/dL 6.2(L) - 5.4(L)  Total Bilirubin 0.3 - 1.2 mg/dL 0.6 - 0.8  Alkaline Phos 38 - 126 U/L 39 - 35(L)  AST 15 - 41 U/L 49(H) - 43(H)  ALT 0 - 44 U/L 28 - 24   Constitutional: young female, no acute distress HEENT: [place],AT; EOMI nl Cardiovascular: RRR, S1 and S2 nl, no m/r/g Respiratory: No respiratory distress, on room air, no wheezing, rhonchi or rales noted GI/Abdomen: soft, nondistended,  nontender, normoactive bowel sounds Neurologic: awake, alert and oriented; no obvious focal deficits noted MSK: moving all extremities spontaneously Skin: nondiaphoretic, nonerythematous Psych: alert, coherent, no visual or auditory hallucination; however seems to have poor insight  Assessment/Plan:  Ms. Shron Ozer is a 19 year old female without any significant past medical history presenting with two days of altered mental status found to have a pneumomediastinum on imaging.    Schizophreniform Disorder: Patient presented with two days of altered mental status. Initial concerns for sepsis with possible meningitis/encephalitis. However, infectious work up has been negative thus far. UA and UDS negative. Leukocytosis resolved.  Patient does not have any history of diagnosed psychiatric illness but her mother has reported concerns for bipolar disorder. On examination, patient is alert and oriented but does not appear to have any insight into her condition. She is able to recall speaking with Dr. Evette Doffing during her hospitalization. 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 She is requesting her medications be sent to the pharmacy on campus. She is less confused this morning.  - Psychiatry consulted, appreciate their recommendations - Bentrozpine 0.5mg  BID - Risperidone 1mg  daily and 2mg  at night - Omega 3 supplement bid   Spontaneous pneumomediastinum: Patient noted  to have retropharyngeal gas on CT Head for which CT Chest obtained confirming an extensive pneumomediastinum extending into the neck, including gas in the retropharyngeal space. Esophagram negative for any perforation. Patient asymptomatic and currently saturating 100% on room air. CXR on 4/18 without pneumothorax. She is tolerating a regular diet without any complications. Will continue with conservative management at this point with analgesia and rest. Patient is stable for discharge to  inpatient psychiatry at this time.   Rhabdomyolysis:  Hypokalemia: Patient noted to have elevated BUN/Cr 19/1.39 and CK> 2000 on presentation in setting of dehydration; improved with IV fluids. Likely pre-renal in setting of dehydration.  Patient tolerating PO diet and renal function improved. Can discontinue fluids at this time. - BMP daily   Tachycardia  Patient noted to be tachycardic with HR 130s this morning while sleeping. This improved to 90-100s upon wakening. She denies any chest pain or shortness of breath at this time. Telemetry with brief sinus tachycardia - EKG - Continue cardiac monitoring  Diet: Regular Fluids: None Electrolytes: Monitor and replete prn    DVT Prophylaxis: Lovenox  Code status: FULL  Prior to Admission Living Arrangement: Home  Anticipated Discharge Location: Inpatient behavioral health  Barriers to Discharge: Pending admit to inpatient behavioral health   Dispo: Anticipated discharge in approximately 1-2 day(s).   Eliezer Bottom, MD  Internal Medicine, PGY-1 11/17/2019, 6:38 AM Pager: [phone]

## 2019-11-18 LAB — CBC
HCT: 36.3 % (ref 36.0–46.0)
Hemoglobin: 12 g/dL (ref 12.0–15.0)
MCH: 28.4 pg (ref 26.0–34.0)
MCHC: 33.1 g/dL (ref 30.0–36.0)
MCV: 85.8 fL (ref 80.0–100.0)
Platelets: 400 10*3/uL (ref 150–400)
RBC: 4.23 MIL/uL (ref 3.87–5.11)
RDW: 12.8 % (ref 11.5–15.5)
WBC: 8.2 10*3/uL (ref 4.0–10.5)
nRBC: 0 % (ref 0.0–0.2)

## 2019-11-18 LAB — BASIC METABOLIC PANEL
Anion gap: 7 (ref 5–15)
BUN: 7 mg/dL (ref 6–20)
CO2: 26 mmol/L (ref 22–32)
Calcium: 9.2 mg/dL (ref 8.9–10.3)
Chloride: 103 mmol/L (ref 98–111)
Creatinine, Ser: 0.67 mg/dL (ref 0.44–1.00)
GFR calc Af Amer: >60 mL/min (ref >60)
GFR calc non Af Amer: >60 mL/min (ref >60)
Glucose, Bld: 89 mg/dL (ref 70–99)
Potassium: 4.2 mmol/L (ref 3.5–5.1)
Sodium: 136 mmol/L (ref 135–145)

## 2019-11-18 MED ORDER — BENZTROPINE MESYLATE 0.5 MG PO TABS: 0.5000 mg | ORAL_TABLET | Freq: Two times a day (BID) | ORAL | 0 refills | Status: DC

## 2019-11-18 MED ORDER — OMEGA-3-ACID ETHYL ESTERS 1 G PO CAPS: 1.0000 g | ORAL_CAPSULE | Freq: Two times a day (BID) | ORAL | 0 refills | Status: DC

## 2019-11-18 MED ORDER — RISPERIDONE 2 MG PO TABS: 2.0000 mg | ORAL_TABLET | Freq: Every day | ORAL | 0 refills | Status: DC

## 2019-11-18 MED FILL — risperiDONE 1 MG TABS: 1 | 30 days supply | Qty: 60 | Fill #0

## 2019-11-18 MED FILL — OMEGA-3 ETHYL ESTERS 1 GM C: 1 | 30 days supply | Qty: 60 | Fill #0

## 2019-11-18 MED FILL — BENZTROPINE MES 0.5 MG TAB: 0.5 | 30 days supply | Qty: 60 | Fill #0

## 2019-11-18 NOTE — Plan of Care (Signed)
Patient seen and she has indeed recovered she no longer has delusional believes, she does not recall much of the previously expressed delusional material but does seem reality based.  I spoke with her mother and she agrees the patient seems back to her old self based on visit yesterday and conversations today.  At present the patient is alert and fully oriented and cooperative she does not express any delusional beliefs or hallucinations or thoughts of self-harm.  No involuntary movements.  Presentation most consistent with schizophreniform disorder that has improved with low-dose Risperdal  Recommendations are to follow-up with outpatient psychiatry no longer in need of inpatient psychiatry  Would continue Risperdal 2 mg at bedtime, and his benztropine 0.5 mg twice daily until she sees her outpatient provider  Would also recommend omega-3's daily for at least 6 months as they have been shown to reduce the risk of conversion to permanent schizophrenia.  Would recommend at least 600 mg of each DHA/EPA with phosphatidylserine [place]

## 2019-11-18 NOTE — Discharge Summary (Signed)
Name: Susan Morgan MRN: [id number] DOB: 2000/11/10 18 y.o. PCP: Patient, No Pcp Per  Date of Admission: 11/14/2019  2:12 AM Date of Discharge: 11/18/2019 Attending Physician: Dr. Jessy Oto  Discharge Diagnosis: 1.Schizophreniform disorder 2. Spontaneous pneumomediastinum  Discharge Medications: Allergies as of 11/18/2019   No Known Allergies     Medication List    TAKE these medications   benztropine 0.5 MG tablet Commonly known as: COGENTIN Take 1 tablet (0.5 mg total) by mouth 2 (two) times daily.   omega-3 acid ethyl esters 1 g capsule Commonly known as: LOVAZA Take 1 capsule (1 g total) by mouth 2 (two) times daily.   risperiDONE 2 MG tablet Commonly known as: RISPERDAL Take 1 tablet (2 mg total) by mouth at bedtime.       Disposition and follow-up:   Susan Morgan was discharged from [facility] in Stable condition.  At the hospital follow up visit please address:  1.  Schizophreniform disorder: Patient to continue risperidone 2mg  daily, benztropine 0.5mg  bid, and omega 3 tablets bid. Patient to f/u with outpatient psychiatry.  Spontaneous pneumomediastinum: Noted on CT chest and soft tissue neck. Managed with supportive care. Stable on discharge  2.  Labs / imaging needed at time of follow-up: None  3.  Pending labs/ test needing follow-up: None  Follow-up Appointments: Follow-up Information    [facility]. Call.   Contact information: [phone]       [employer] [facility]. Call.   Contact information: [phone]          Hospital Course by problem list: 1. Schizophreniform disorder Patient presented with two days of altered mental status. Initial concerns for sepsis with possible meningitis/encephalitis. However, infectious work up negative. UA and UDS negative. On examination, patient was alert and oriented but speech noted to be pressured and tangential. She seemed to be  having delusions regarding being kidnapped 3 weeks ago and held in [place] and having to walk and swim back to [place]. Per patient's mother, she spoke with her two nights prior to her admission and was normal. Patient does not have a history of psychiatric illness but her mother did report concerns for possible bipolar disorder. Psychiatry consulted and patient started on risperidone 2mg  daily, benztropine 0.5mg  twice daily and omega-3 tablets twice daily. Patient's mental status significantly improved although she still had some paranoid delusions. Patient remains calm and cooperative. She does not meet criteria for inpatient psychiatry at this time. She is to be discharged with antipsychotic medications and follow up with outpatient psychiatry.   2. Spontaneous pneumothorax Patient noted to have retropharyngeal gas on CT Head for which CT Chest obtained confirming an extensive pneumomediastinum extending into the neck. Esophagram negative for any perforation. CXR on 4/18 without pneumothorax. Patient asymptomatic through hospitalization and saturating 100% on room air.   Discharge Vitals:   BP 105/65 (BP Location: Left Arm)   Pulse 97   Temp 97.9 F (36.6 C) (Oral)   Resp 15   Ht 5\' 3"  (1.6 m)   Wt 53.4 kg   LMP  (LMP Unknown) Comment: preg test negative 11/14/19  SpO2 100%   BMI 20.85 kg/m   Pertinent Labs, Studies, and Procedures:  CBC Latest Ref Rng & Units 11/18/2019 11/17/2019 11/16/2019  WBC 4.0 - 10.5 K/uL 8.2 9.2 9.0  Hemoglobin 12.0 - 15.0 g/dL 11/20/2019 11/19/2019 10.5(L)  Hematocrit 36.0 - 46.0 % 36.3 37.2 31.9(L)  Platelets 150 - 400 K/uL 400 401(H) 329  CMP Latest Ref Rng & Units 11/18/2019 11/17/2019 11/16/2019  Glucose 70 - 99 mg/dL 89 485(I) 62(V)  BUN 6 - 20 mg/dL 7 6 6   Creatinine 0.44 - 1.00 mg/dL 0.35 0.09  Sodium 135 - 145 mmol/L 136 138 136  Potassium 3.5 - 5.1 mmol/L 4.2 3.5 3.7  Chloride 98 - 111 mmol/L 103 104 100  CO2 22 - 32 mmol/L 26 26 21(L)  Calcium 8.9 -  10.3 mg/dL 9.2 3.81) 8.3(L)  Total Protein 6.5 - 8.1 g/dL - 6.2(L) -  Total Bilirubin 0.3 - 1.2 mg/dL - 0.6 -  Alkaline Phos 38 - 126 U/L - 39 -  AST 15 - 41 U/L - 49(H) -  ALT 0 - 44 U/L - 28 -   Component     Latest Ref Rng & Units 11/14/2019 11/15/2019 11/16/2019  CK Total     38 - 234 U/L 2,251 (H) 745 (H) 582 (H)   Component     Latest Ref Rng & Units 11/14/2019 11/14/2019         5:15 AM  7:08 AM  Lactic Acid, Venous     0.5 - 1.9 mmol/L 2.3 (HH) 1.4   Component     Latest Ref Rng & Units 11/14/2019  Salicylate Lvl     7.0 - 30.0 mg/dL 11/16/2019 (L)   Component     Latest Ref Rng & Units 11/14/2019  Acetaminophen (Tylenol), S     10 - 30 ug/mL <10 (L)   Component     Latest Ref Rng & Units 11/13/2019 11/14/2019 11/14/2019          5:04 AM  5:10 PM  Troponin I (High Sensitivity)     <18 ng/L 22 (H) 32 (H) 23 (H)   Component     Latest Ref Rng & Units 11/14/2019        10:02 AM  Tube #      1  Color, CSF     COLORLESS PINK (A)  Appearance, CSF     CLEAR HAZY (A)  Supernatant      COLORLESS  RBC Count, CSF     0 /cu mm 2,090 (H)  WBC, CSF     0 - 5 /cu mm 2  Segmented Neutrophils-CSF     0 - 6 % TOO FEW TO COUNT, SMEAR AVAILABLE FOR REVIEW  Lymphs, CSF     40 - 80 % TOO FEW TO COUNT, SMEAR AVAILABLE FOR REVIEW  Monocyte-Macrophage-Spinal Fluid     15 - 45 % TOO FEW TO COUNT, SMEAR AVAILABLE FOR REVIEW  Eosinophils, CSF     0 - 1 % TOO FEW TO COUNT, SMEAR AVAILABLE FOR REVIEW  Other Cells, CSF      FEW NEUTROPHILS   Component     Latest Ref Rng & Units 11/14/2019        10:03 AM  Tube #      4  Color, CSF     COLORLESS COLORLESS  Appearance, CSF     CLEAR CLEAR (A)  Supernatant      NOT INDICATED  RBC Count, CSF     0 /cu mm 66 (H)  WBC, CSF     0 - 5 /cu mm 0  Segmented Neutrophils-CSF     0 - 6 % TOO FEW TO COUNT, SMEAR AVAILABLE FOR REVIEW  Lymphs, CSF     40 - 80 % TOO FEW TO COUNT, SMEAR AVAILABLE FOR REVIEW  Monocyte-Macrophage-Spinal  Fluid      15 - 45 % TOO FEW TO COUNT, SMEAR AVAILABLE FOR REVIEW  Eosinophils, CSF     0 - 1 % TOO FEW TO COUNT, SMEAR AVAILABLE FOR REVIEW  Other Cells, CSF      FEW LYMPHOCYTES AND MONOCYTES/MACROPHAGES   Component     Latest Ref Rng & Units 11/14/2019  Glucose, CSF     40 - 70 mg/dL 65  Total  Protein, CSF     15 - 45 mg/dL 22   Component     Latest Ref Rng & Units 11/14/2019  VDRL Quant, CSF     Non Rea:<1:1 Non Reactive   Component     Latest Ref Rng & Units 11/14/2019  SARS Coronavirus 2 by RT PCR     NEGATIVE NEGATIVE  Influenza A By PCR     NEGATIVE NEGATIVE  Influenza B By PCR     NEGATIVE NEGATIVE   Component     Latest Ref Rng & Units 11/14/2019  Color, Urine     YELLOW YELLOW  Appearance     CLEAR HAZY (A)  Specific Gravity, Urine     1.005 - 1.030 1.026  pH     5.0 - 8.0 6.0  Glucose, UA     NEGATIVE mg/dL NEGATIVE  Hgb urine dipstick     NEGATIVE SMALL (A)  Bilirubin Urine     NEGATIVE NEGATIVE  Ketones, ur     NEGATIVE mg/dL 20 (A)  Protein     NEGATIVE mg/dL NEGATIVE  Nitrite     NEGATIVE NEGATIVE  Leukocytes,Ua     NEGATIVE SMALL (A)  RBC / HPF     0 - 5 RBC/hpf 0-5  WBC, UA     0 - 5 WBC/hpf 0-5  Bacteria, UA     NONE SEEN RARE (A)  Squamous Epithelial / LPF     0 - 5 0-5  Mucus      PRESENT   Component     Latest Ref Rng & Units 11/14/2019  Opiates     NONE DETECTED NONE DETECTED  COCAINE     NONE DETECTED NONE DETECTED  Benzodiazepines     NONE DETECTED NONE DETECTED  Amphetamines     NONE DETECTED NONE DETECTED  Tetrahydrocannabinol     NONE DETECTED NONE DETECTED  Barbiturates     NONE DETECTED NONE DETECTED   CXR 11/13/2019: IMPRESSION: No active disease.  CT HEAD WO CONTRAST 11/14/2019 IMPRESSION: 1. No acute intracranial abnormality. 2. Large amount of retropharyngeal gas, incompletely visualized. This likely indicates air ascending from pneumomediastinum. CT of the neck is recommended.  CXR 11/14/2019: IMPRESSION: No  active disease.  CT CHEST W CONTRAST 11/14/2019: IMPRESSION: Extensive pneumomediastinum, without cause identified  CT SOFT TISSUE NECK W CONTRAST 11/14/2019: IMPRESSION: Pneumomediastinum extending into the neck including gas throughout the retropharyngeal space. No fluid collection or foreign body identified in the neck.  ESOPHAGRAM 11/14/2019: IMPRESSION: No esophageal perforation identified. No extraluminal contrast material within the mediastinum.  CXR 11/15/2019: IMPRESSION: The pneumomediastinum identified on prior CT scan is not well visualized on this radiograph. No other definite abnormality is Noted.  X-RAY ABDOMEN 11/15/2019: IMPRESSION: No evidence of bowel obstruction or ileus.  Discharge Instructions: Discharge Instructions    Call MD for:  difficulty breathing, headache or visual disturbances   Complete by: As directed    Call MD for:  extreme fatigue   Complete by: As directed    Call MD for:  hives   Complete by: As directed    Call MD for:  persistant dizziness or light-headedness   Complete by: As directed    Call MD for:  persistant nausea and vomiting   Complete by: As directed    Call MD for:  severe uncontrolled pain   Complete by: As directed    Call MD for:  temperature >100.4   Complete by: As directed    Diet - low sodium heart healthy   Complete by: As directed    Discharge instructions   Complete by: As directed    Ms. Lorenda, Grecco were admitted to the hospital following an episode of confusion and were noted to have air in your chest. As far as the air in your chest, we followed up with CXR and this seems to have improved. We were concerned for an infection to be the source of your confusion; however, your labs were normal. We believe that your episode was due to a psychotic break and you were started on medications for this. Please continue to take this and follow up with a psychiatrist as outpatient.  Thank you!   Increase activity  slowly   Complete by: As directed       Signed: Harvie Heck, MD  Internal Medicine, PGY-1 11/19/2019, 4:46 PM   Pager: [phone]

## 2019-11-18 NOTE — TOC Initial Note (Signed)
Transition of Care [facility]) - Initial/Assessment Note    Patient Details  Name: Susan Morgan MRN: [id number] Date of Birth: 09-14-2000  Transition of Care [facility]) CM/SW Contact:    Eduard Roux, LCSWA Phone Number: 11/18/2019, 12:41 PM  Clinical Narrative:                  CSW visit with the patient at bedside. CSW introduced self and explained role. CSW discuss current discharge plan to inpatient psychiatry. Patient was alert and oriented and understood current plan. Patient confirm she was student at [employer] and preference is to follow up with outpatient psychiarty on campus. CSW informed patient psych re-evaluation was requested to determine if inpatient psych at behavioral hospital remains appropriate. Patient states she understands.   Antony Blackbird, MSW, LCSWA Clinical Social Worker    Expected Discharge Plan: Home/Self Care     Patient Goals and CMS Choice        Expected Discharge Plan and Services Expected Discharge Plan: Home/Self Care In-house Referral: Clinical Social Work                                            Prior Living Arrangements/Services   Lives with:: Self, Other (Comment)(student)   Do you feel safe going back to the place where you live?: Yes               Activities of Daily Living Home Assistive Devices/Equipment: None ADL Screening (condition at time of admission) Patient's cognitive ability adequate to safely complete daily activities?: Yes Is the patient deaf or have difficulty hearing?: No Does the patient have difficulty seeing, even when wearing glasses/contacts?: No Does the patient have difficulty concentrating, remembering, or making decisions?: No Patient able to express need for assistance with ADLs?: No Does the patient have difficulty dressing or bathing?: No Independently performs ADLs?: Yes (appropriate for developmental age) Does the patient have difficulty walking or climbing stairs?: No Weakness of  Legs: None Weakness of Arms/Hands: None  Permission Sought/Granted                  Emotional Assessment Appearance:: Appears stated age Attitude/Demeanor/Rapport: Engaged Affect (typically observed): Calm, Accepting, Appropriate, Pleasant Orientation: : Oriented to Self, Oriented to Place, Oriented to  Time, Oriented to Situation Alcohol / Substance Use: Not Applicable Psych Involvement: Yes (comment)  Admission diagnosis:  Pneumomediastinum (HCC) [J98.2] Esophageal abnormality [K22.9] Altered mental status, unspecified altered mental status type [R41.82] Patient Active Problem List   Diagnosis Date Noted  . Schizophreniform disorder (HCC) 11/15/2019  . Pneumomediastinum (HCC) 11/14/2019  . Acute psychosis (HCC) 11/14/2019  . Acute kidney injury (HCC) 11/14/2019   PCP:  Patient, No Pcp Per Pharmacy:   [facility] [phone] - [place], [place] - [address] AT [facility] [address] [place] [address] Phone: [phone] Fax: [phone]     Social Determinants of Health (SDOH) Interventions    Readmission Risk Interventions No flowsheet data found.

## 2019-11-18 NOTE — Progress Notes (Signed)
Subjective: HD 4 Overnight no acute events  This morning, patient evaluated at bedside. Pt states that she has no complaints today. She slept well last night. Denies any chest pain or SOB This AM. She has also been eating well. She says when she came in, she remember Susan Morgan and someone named Susan Morgan and something tasting like sour milk. She doesn't remember anything prior to the hospital. We explained that we were told that she was knocking on random apartment doors and that she was confused. Patient notes that she remembers a little bit about it but does not remember much. Discussed that she is being evaluated for psychosis; patient expresses understanding.   Objective: Vital signs in last 24 hours: Vitals:   11/17/19 1251 11/17/19 1541 11/17/19 1925 11/18/19 0410  BP: 124/83 111/69 126/86 116/78  Pulse: 98 94 91   Resp: 15 18 17 16   Temp: 98.3 F (36.8 C) 97.8 F (36.6 C) 97.7 F (36.5 C) 98.2 F (36.8 C)  TempSrc: Oral Oral Oral Oral  SpO2: 100% 100% 99% 100%  Weight:      Height:       CBC Latest Ref Rng & Units 11/18/2019 11/17/2019 11/16/2019  WBC 4.0 - 10.5 K/uL 8.2 9.2 9.0  Hemoglobin 12.0 - 15.0 g/dL 11/18/2019 99.3 10.5(L)  Hematocrit 36.0 - 46.0 % 36.3 37.2 31.9(L)  Platelets 150 - 400 K/uL 400 401(H) 329   CMP Latest Ref Rng & Units 11/18/2019 11/17/2019 11/16/2019  Glucose 70 - 99 mg/dL 89 11/18/2019) 967(E)  BUN 6 - 20 mg/dL 7 6 6   Creatinine 0.44 - 1.00 mg/dL 93(Y 1.01  Sodium 135 - 145 mmol/L 136 138 136  Potassium 3.5 - 5.1 mmol/L 4.2 3.5 3.7  Chloride 98 - 111 mmol/L 103 104 100  CO2 22 - 32 mmol/L 26 26 21(L)  Calcium 8.9 - 10.3 mg/dL 9.2 7.51) 8.3(L)  Total Protein 6.5 - 8.1 g/dL - 6.2(L) -  Total Bilirubin 0.3 - 1.2 mg/dL - 0.6 -  Alkaline Phos 38 - 126 U/L - 39 -  AST 15 - 41 U/L - 49(H) -  ALT 0 - 44 U/L - 28 -   Constitutional: young female, no acute distress HEENT: [name],AT; EOMI nl Cardiovascular: RRR, S1 and S2 nl, no m/r/g Respiratory: No  respiratory distress, on room air, no wheezing, rhonchi or rales noted GI/Abdomen: soft, nondistended, nontender, normoactive bowel sounds Neurologic: awake, alert and oriented; no obvious focal deficits noted MSK: moving all extremities spontaneously Skin: nondiaphoretic, nonerythematous Psych: alert, coherent, no visual or auditory hallucination; however seems to have poor insight  Assessment/Plan:  [name] is a 19 year old female without any significant past medical history presenting with two days of altered mental status found to have a pneumomediastinum on imaging.    Schizophreniform Disorder: Patient presented with two days of altered mental status. Initial concerns for sepsis with possible meningitis/encephalitis. However, infectious work up has been negative thus far. UA and UDS negative. Leukocytosis resolved.  Patient does not have any history of diagnosed psychiatric illness but her mother has reported concerns for bipolar disorder. On examination, patient is alert and oriented but does not appear to have much insight into her condition. She is able to recall speaking with Susan Morgan during her hospitalization. When recounted about the events leading up to her hospitalization, she notes possibly remembering something but is not sure. Discussed that she was being evaluated for a psychotic break. Patient expresses understanding.  - Psychiatry  consulted, appreciate their recommendations - Bentrozpine 0.5mg  BID - Risperidone 1mg  daily and 2mg  at night - Omega 3 supplement bid   Spontaneous pneumomediastinum: Patient noted to have retropharyngeal gas on CT Head for which CT Chest obtained confirming an extensive pneumomediastinum extending into the neck. Esophagram negative for any perforation. Patient asymptomatic and currently saturating 100% on room air. CXR on 4/18 without pneumothorax. She is tolerating a regular diet without any complications. Patient is stable for  discharge to inpatient psychiatry at this time.    Diet: Regular Fluids: None Electrolytes: Monitor and replete prn    DVT Prophylaxis: Lovenox  Code status: FULL  Prior to Admission Living Arrangement: Home  Anticipated Discharge Location: Inpatient behavioral health (pending re-evaluation by psychiatry)  Barriers to Discharge: Pending admit to inpatient behavioral health   Dispo: Anticipated discharge in approximately 1-2 day(s).   Susan Heck, MD  Internal Medicine, PGY-1 11/18/2019, 6:47 AM Pager: [phone]

## 2019-11-18 NOTE — Progress Notes (Signed)
Discharge AVS meds take and those due reviewed with pt. Follow up appointments and when to call MD reviewed. All questions and concerns addressed. No further questions at this time. D/c IV and TELE, CCMD notified. D/C home per orders. Pt meds delivered to room from [facility] pharm. Pt brought down via wheelchair with all belongings. Theophilus Kinds, RN

## 2019-11-19 LAB — CULTURE, BLOOD (ROUTINE X 2)
Culture: NO GROWTH
Culture: NO GROWTH
Special Requests: ADEQUATE

## 2021-09-26 ENCOUNTER — Encounter (HOSPITAL_COMMUNITY): Payer: Self-pay | Admitting: Obstetrics & Gynecology

## 2021-09-26 ENCOUNTER — Inpatient Hospital Stay (HOSPITAL_COMMUNITY)
Admission: AD | Admit: 2021-09-26 | Discharge: 2021-09-26 | Disposition: A | Discharge status: Home / Self Care | Payer: Medicaid Other | Attending: Obstetrics & Gynecology | Admitting: Obstetrics & Gynecology

## 2021-09-26 ENCOUNTER — Inpatient Hospital Stay (HOSPITAL_BASED_OUTPATIENT_CLINIC_OR_DEPARTMENT_OTHER): Payer: Medicaid Other

## 2021-09-26 DIAGNOSIS — O212 Late vomiting of pregnancy: Secondary | ICD-10-CM | POA: Diagnosis present

## 2021-09-26 DIAGNOSIS — O10919 Unspecified pre-existing hypertension complicating pregnancy, unspecified trimester: Secondary | ICD-10-CM

## 2021-09-26 DIAGNOSIS — O0933 Supervision of pregnancy with insufficient antenatal care, third trimester: Secondary | ICD-10-CM

## 2021-09-26 DIAGNOSIS — O219 Vomiting of pregnancy, unspecified: Secondary | ICD-10-CM | POA: Diagnosis not present

## 2021-09-26 DIAGNOSIS — Z3687 Encounter for antenatal screening for uncertain dates: Secondary | ICD-10-CM | POA: Diagnosis not present

## 2021-09-26 DIAGNOSIS — Z3A33 33 weeks gestation of pregnancy: Secondary | ICD-10-CM | POA: Diagnosis not present

## 2021-09-26 DIAGNOSIS — O10913 Unspecified pre-existing hypertension complicating pregnancy, third trimester: Secondary | ICD-10-CM | POA: Diagnosis not present

## 2021-09-26 DIAGNOSIS — O10013 Pre-existing essential hypertension complicating pregnancy, third trimester: Secondary | ICD-10-CM | POA: Diagnosis not present

## 2021-09-26 DIAGNOSIS — Z3689 Encounter for other specified antenatal screening: Secondary | ICD-10-CM | POA: Diagnosis not present

## 2021-09-26 HISTORY — DX: Mental disorder, not otherwise specified: F99

## 2021-09-26 HISTORY — DX: Essential (primary) hypertension: I10

## 2021-09-26 LAB — URINALYSIS, ROUTINE W REFLEX MICROSCOPIC
Bilirubin Urine: NEGATIVE
Glucose, UA: NEGATIVE mg/dL
Hgb urine dipstick: NEGATIVE
Ketones, ur: NEGATIVE mg/dL
Leukocytes,Ua: NEGATIVE
Nitrite: NEGATIVE
Protein, ur: NEGATIVE mg/dL
Specific Gravity, Urine: 1.012 (ref 1.005–1.030)
pH: 6 (ref 5.0–8.0)

## 2021-09-26 LAB — CBC
HCT: 34.4 % — ABNORMAL LOW (ref 36.0–46.0)
Hemoglobin: 11.3 g/dL — ABNORMAL LOW (ref 12.0–15.0)
MCH: 27.9 pg (ref 26.0–34.0)
MCHC: 32.8 g/dL (ref 30.0–36.0)
MCV: 84.9 fL (ref 80.0–100.0)
Platelets: 378 10*3/uL (ref 150–400)
RBC: 4.05 MIL/uL (ref 3.87–5.11)
RDW: 13.7 % (ref 11.5–15.5)
WBC: 15.5 10*3/uL — ABNORMAL HIGH (ref 4.0–10.5)
nRBC: 0 % (ref 0.0–0.2)

## 2021-09-26 LAB — HIV ANTIBODY (ROUTINE TESTING W REFLEX): HIV Screen 4th Generation wRfx: NONREACTIVE

## 2021-09-26 LAB — COMPREHENSIVE METABOLIC PANEL
ALT: 37 U/L (ref 0–44)
AST: 27 U/L (ref 15–41)
Albumin: 3 g/dL — ABNORMAL LOW (ref 3.5–5.0)
Alkaline Phosphatase: 107 U/L (ref 38–126)
Anion gap: 7 (ref 5–15)
BUN: 5 mg/dL — ABNORMAL LOW (ref 6–20)
CO2: 23 mmol/L (ref 22–32)
Calcium: 8.9 mg/dL (ref 8.9–10.3)
Chloride: 106 mmol/L (ref 98–111)
Creatinine, Ser: 0.62 mg/dL (ref 0.44–1.00)
GFR, Estimated: >60 mL/min (ref >60)
Glucose, Bld: 84 mg/dL (ref 70–99)
Potassium: 3.6 mmol/L (ref 3.5–5.1)
Sodium: 136 mmol/L (ref 135–145)
Total Bilirubin: 0.3 mg/dL (ref 0.3–1.2)
Total Protein: 6.8 g/dL (ref 6.5–8.1)

## 2021-09-26 LAB — PROTEIN / CREATININE RATIO, URINE
Creatinine, Urine: 85.18 mg/dL
Protein Creatinine Ratio: 0.19 mg/mg{Cre} — ABNORMAL HIGH (ref 0.00–0.15)
Total Protein, Urine: 16 mg/dL

## 2021-09-26 LAB — RAPID URINE DRUG SCREEN, HOSP PERFORMED
Amphetamines: NOT DETECTED
Barbiturates: NOT DETECTED
Benzodiazepines: NOT DETECTED
Cocaine: NOT DETECTED
Opiates: NOT DETECTED
Tetrahydrocannabinol: POSITIVE — AB

## 2021-09-26 LAB — DIFFERENTIAL
Abs Immature Granulocytes: 0.31 10*3/uL — ABNORMAL HIGH (ref 0.00–0.07)
Basophils Absolute: 0.1 10*3/uL (ref 0.0–0.1)
Basophils Relative: 0 %
Eosinophils Absolute: 0.4 10*3/uL (ref 0.0–0.5)
Eosinophils Relative: 3 %
Immature Granulocytes: 2 %
Lymphocytes Relative: 10 %
Lymphs Abs: 1.6 10*3/uL (ref 0.7–4.0)
Monocytes Absolute: 1.2 10*3/uL — ABNORMAL HIGH (ref 0.1–1.0)
Monocytes Relative: 8 %
Neutro Abs: 11.9 10*3/uL — ABNORMAL HIGH (ref 1.7–7.7)
Neutrophils Relative %: 77 %

## 2021-09-26 LAB — HEPATITIS C ANTIBODY: HCV Ab: NEGATIVE

## 2021-09-26 LAB — HEMOGLOBIN A1C
Hgb A1c MFr Bld: 4.9 % (ref 4.8–5.6)
Mean Plasma Glucose: 93.93 mg/dL

## 2021-09-26 LAB — HEPATITIS B SURFACE ANTIGEN: Hepatitis B Surface Ag: NONREACTIVE

## 2021-09-26 LAB — RPR: RPR Ser Ql: NONREACTIVE

## 2021-09-26 LAB — TYPE AND SCREEN
ABO/RH(D): A POS
Antibody Screen: NEGATIVE

## 2021-09-26 LAB — OB RESULTS CONSOLE HIV ANTIBODY (ROUTINE TESTING): HIV: NONREACTIVE

## 2021-09-26 NOTE — MAU Note (Signed)
Susan Morgan is a 21 y.o. here in MAU reporting: states she is here because she needs to be seen for an u/s because she has not had one yet with this pregnancy. No pain, bleeding, or LOF. +FM  LMP: unknown  Onset of complaint: ongoing  Pain score: 0/10  Vitals:   09/26/21 1000  BP: (!) 151/76  Pulse: (!) 125  Resp: 16  Temp: 98.3 F (36.8 C)  SpO2: 95%     FHT:143  Lab orders placed from triage: UA

## 2021-09-26 NOTE — MAU Provider Note (Signed)
History     CSN: [id number]  Arrival date and time: 09/26/21 [id number]   Event Date/Time   First Provider Initiated Contact with Patient 09/26/21 1055      Chief Complaint  Patient presents with   Pregnancy Ultrasound   HPI Susan Morgan is a 21 y.o. G1P0 at 6748w3d who presents with nausea. She reports this is an ongoing issue in the pregnancy. She states she only eats food that don't make her nauseous because she will throw up anything that is not a safe food. She denies any abdominal pain, vaginal bleeding or discharge. She reports normal fetal movement.   She has not had any care this pregnancy. She reports a LMP of mid July and thinks she is 6 months pregnant. She reports a history of hypertension since she was 21 years old. She denies any HA, visual changes or epigastric pain.   OB History     Gravida  1   Para      Term      Preterm      AB      Living         SAB      IAB      Ectopic      Multiple      Live Births              Past Medical History:  Diagnosis Date   Hypertension    Mental disorder     Past Surgical History:  Procedure Laterality Date   NO PAST SURGERIES      History reviewed. No pertinent family history.  Social History   Tobacco Use   Smoking status: Never   Smokeless tobacco: Never  Vaping Use   Vaping Use: Never used  Substance Use Topics   Alcohol use: Never   Drug use: Never    Allergies: No Known Allergies  Medications Prior to Admission  Medication Sig Dispense Refill Last Dose   Prenatal Vit-Fe Fumarate-FA (PRENATAL MULTIVITAMIN) TABS tablet Take 1 tablet by mouth daily at 12 noon.   09/25/2021   benztropine (COGENTIN) 0.5 MG tablet Take 1 tablet (0.5 mg total) by mouth 2 (two) times daily. 60 tablet 0    omega-3 acid ethyl esters (LOVAZA) 1 g capsule Take 1 capsule (1 g total) by mouth 2 (two) times daily. 60 capsule 0    risperiDONE (RISPERDAL) 2 MG tablet Take 1 tablet (2 mg total) by mouth at bedtime.  30 tablet 0     Review of Systems  Constitutional: Negative.  Negative for fatigue and fever.  HENT: Negative.    Respiratory: Negative.  Negative for shortness of breath.   Cardiovascular: Negative.  Negative for chest pain.  Gastrointestinal:  Positive for nausea. Negative for abdominal pain, constipation, diarrhea and vomiting.  Genitourinary: Negative.  Negative for dysuria, vaginal bleeding and vaginal discharge.  Neurological: Negative.  Negative for dizziness and headaches.  Physical Exam   Blood pressure 134/79, pulse (!) 112, temperature 98.3 F (36.8 C), temperature source Oral, resp. rate 16, height 5\' 4"  (1.626 m), weight 78 kg, SpO2 99 %.  Physical Exam Vitals and nursing note reviewed.  Constitutional:      General: She is not in acute distress.    Appearance: She is well-developed.  HENT:     Head: Normocephalic.  Eyes:     Pupils: Pupils are equal, round, and reactive to light.  Cardiovascular:     Rate and Rhythm: Normal rate and regular  rhythm.     Heart sounds: Normal heart sounds.  Pulmonary:     Effort: Pulmonary effort is normal. No respiratory distress.     Breath sounds: Normal breath sounds.  Abdominal:     General: Bowel sounds are normal. There is no distension.     Palpations: Abdomen is soft.     Tenderness: There is no abdominal tenderness.     Comments: FH: 33cm  Skin:    General: Skin is warm and dry.  Neurological:     Mental Status: She is alert and oriented to person, place, and time.  Psychiatric:        Attention and Perception: She is inattentive.        Mood and Affect: Mood is anxious. Affect is labile.        Speech: Speech is tangential.        Behavior: Behavior is hyperactive.        Thought Content: Thought content normal.        Judgment: Judgment normal.   Fetal Tracing:  Baseline: 140 Variability: moderate Accels: 15x15 Decels: none  Toco: none  MAU Course  Procedures Results for orders placed or performed  during the hospital encounter of 09/26/21 (from the past 24 hour(s))  Urinalysis, Routine w reflex microscopic Urine, Clean Catch     Status: Abnormal   Collection Time: 09/26/21 10:07 AM  Result Value Ref Range   Color, Urine YELLOW YELLOW   APPearance HAZY (A) CLEAR   Specific Gravity, Urine 1.012 1.005 - 1.030   pH 6.0 5.0 - 8.0   Glucose, UA NEGATIVE NEGATIVE mg/dL   Hgb urine dipstick NEGATIVE NEGATIVE   Bilirubin Urine NEGATIVE NEGATIVE   Ketones, ur NEGATIVE NEGATIVE mg/dL   Protein, ur NEGATIVE NEGATIVE mg/dL   Nitrite NEGATIVE NEGATIVE   Leukocytes,Ua NEGATIVE NEGATIVE  Protein / creatinine ratio, urine     Status: Abnormal   Collection Time: 09/26/21 10:07 AM  Result Value Ref Range   Creatinine, Urine 85.18 mg/dL   Total Protein, Urine 16 mg/dL   Protein Creatinine Ratio 0.19 (H) 0.00 - 0.15 mg/mg[Cre]  Urine rapid drug screen (hosp performed)     Status: Abnormal   Collection Time: 09/26/21 10:07 AM  Result Value Ref Range   Opiates NONE DETECTED NONE DETECTED   Cocaine NONE DETECTED NONE DETECTED   Benzodiazepines NONE DETECTED NONE DETECTED   Amphetamines NONE DETECTED NONE DETECTED   Tetrahydrocannabinol POSITIVE (A) NONE DETECTED   Barbiturates NONE DETECTED NONE DETECTED  Hepatitis B surface antigen     Status: None   Collection Time: 09/26/21 11:19 AM  Result Value Ref Range   Hepatitis B Surface Ag NON REACTIVE NON REACTIVE  CBC     Status: Abnormal   Collection Time: 09/26/21 11:19 AM  Result Value Ref Range   WBC 15.5 (H) 4.0 - 10.5 K/uL   RBC 4.05 3.87 - 5.11 MIL/uL   Hemoglobin 11.3 (L) 12.0 - 15.0 g/dL   HCT 34.4 (L) 36.0 - 46.0 %   MCV 84.9 80.0 - 100.0 fL   MCH 27.9 26.0 - 34.0 pg   MCHC 32.8 30.0 - 36.0 g/dL   RDW 13.7 11.5 - 15.5 %   Platelets 378 150 - 400 K/uL   nRBC 0.0 0.0 - 0.2 %  Differential     Status: Abnormal   Collection Time: 09/26/21 11:19 AM  Result Value Ref Range   Neutrophils Relative % 77 %   Neutro Abs 11.9 (H) 1.7 -  7.7 K/uL   Lymphocytes Relative 10 %   Lymphs Abs 1.6 0.7 - 4.0 K/uL   Monocytes Relative 8 %   Monocytes Absolute 1.2 (H) 0.1 - 1.0 K/uL   Eosinophils Relative 3 %   Eosinophils Absolute 0.4 0.0 - 0.5 K/uL   Basophils Relative 0 %   Basophils Absolute 0.1 0.0 - 0.1 K/uL   Immature Granulocytes 2 %   Abs Immature Granulocytes 0.31 (H) 0.00 - 0.07 K/uL  Type and screen [place]     Status: None   Collection Time: 09/26/21 11:19 AM  Result Value Ref Range   ABO/RH(D) A POS    Antibody Screen NEG    Sample Expiration      09/29/2021,2359 Performed at [facility], [place] [address], [place], [place] [address]   HIV Antibody (routine testing w rflx)     Status: None   Collection Time: 09/26/21 11:19 AM  Result Value Ref Range   HIV Screen 4th Generation wRfx Non Reactive Non Reactive  Comprehensive metabolic panel     Status: Abnormal   Collection Time: 09/26/21 11:19 AM  Result Value Ref Range   Sodium 136 135 - 145 mmol/L   Potassium 3.6 3.5 - 5.1 mmol/L   Chloride 106 98 - 111 mmol/L   CO2 23 22 - 32 mmol/L   Glucose, Bld 84 70 - 99 mg/dL   BUN 5 (L) 6 - 20 mg/dL   Creatinine, Ser 0.62 0.44 - 1.00 mg/dL   Calcium 8.9 8.9 - 10.3 mg/dL   Total Protein 6.8 6.5 - 8.1 g/dL   Albumin 3.0 (L) 3.5 - 5.0 g/dL   AST 27 15 - 41 U/L   ALT 37 0 - 44 U/L   Alkaline Phosphatase 107 38 - 126 U/L   Total Bilirubin 0.3 0.3 - 1.2 mg/dL   GFR, Estimated >60 >60 mL/min   Anion gap 7 5 - 15  Hemoglobin A1c     Status: None   Collection Time: 09/26/21 11:19 AM  Result Value Ref Range   Hgb A1c MFr Bld 4.9 4.8 - 5.6 %   Mean Plasma Glucose 93.93 mg/dL    [place] MFM OB Comp + 14 Weeks  Result Date: 09/26/2021 ----------------------------------------------------------------------  OBSTETRICS REPORT                       (Signed Final 09/26/2021 01:44 pm) ---------------------------------------------------------------------- Patient Info  ID #:       [id number]                           D.O.B.:  May 04, 2001 (20 yrs)  Name:       Susan Morgan               Visit Date: 09/26/2021 01:07 pm ---------------------------------------------------------------------- Performed By  Attending:        Johnell Comings MD         Referred By:      [facility] MAU/Triage  Performed By:     Jeanene Erb BS,      [facility]  [place] ---------------------------------------------------------------------- Orders  #  Description                           Code        Ordered By  1  [place] MFM OB COMP + 14 WK                76805.01    Tuyen Uncapher ----------------------------------------------------------------------  #  Order #                     Accession #                Episode #  1  [id number]                   [id number]                 [id number] ---------------------------------------------------------------------- Indications  Hypertension - Chronic/Pre-existing            O10.019  Late to prenatal care, third trimester         O09.33  Encounter for uncertain dates                  Z36.87  [redacted] weeks gestation of pregnancy                Z3A.33  Encounter for antenatal screening for          Z36.3  malformations ---------------------------------------------------------------------- Fetal Evaluation  Num Of Fetuses:         1  Fetal Heart Rate(bpm):  143  Cardiac Activity:       Observed  Presentation:           Cephalic  Placenta:               Anterior  P. Cord Insertion:      Visualized  Amniotic Fluid  AFI FV:      Within normal limits  AFI Sum(cm)     %Tile       Largest Pocket(cm)  15.8            57          7.3  RUQ(cm)       RLQ(cm)       LUQ(cm)        LLQ(cm)  7.3           1.8           3.3            3.4 ---------------------------------------------------------------------- Biometry  BPD:        84  mm     G. Age:  33w 6d         57  %    CI:         78.3   %    70 - 86                                                           FL/HC:      21.6   %    19.9 - 21.5  HC:      300.3  mm     G. Age:  33w 2d  13  %    HC/AC:      1.02        0.96 - 1.11  AC:      293.4  mm     G. Age:  33w 2d         49  %    FL/BPD:     77.3   %    71 - 87  FL:       64.9  mm     G. Age:  33w 3d         40  %    FL/AC:      22.1   %    20 - 24  Est. FW:    2190  gm    4 lb 13 oz      41  % ---------------------------------------------------------------------- OB History  Gravidity:    1         Term:   0        Prem:   0        SAB:   0  TOP:          0       Ectopic:  0        Living: 0 ---------------------------------------------------------------------- Gestational Age  U/S Today:     33w 3d                                        EDD:   11/11/21  Best:          33w 3d     Det. By:  U/S (09/26/21)           EDD:   11/11/21 ---------------------------------------------------------------------- Anatomy  Cranium:               Appears normal         LVOT:                   Appears normal  Cavum:                 Not well visualized    Aortic Arch:            Not well visualized  Ventricles:            Appears normal         Ductal Arch:            Not well visualized  Choroid Plexus:        Appears normal         Diaphragm:              Appears normal  Cerebellum:            Not well visualized    Stomach:                Appears normal, left                                                                        sided  Posterior Fossa:       Not well visualized    Abdomen:  Appears normal  Nuchal Fold:           Not applicable ([id number]    Abdominal Wall:         Not well visualized                         wks GA)  Face:                  Orbits appear          Cord Vessels:           Appears normal (3                         normal                                         vessel cord)  Lips:                  Not well visualized    Kidneys:                Appear normal  Palate:                Appears normal         Bladder:                 Appears normal  Thoracic:              Appears normal         Spine:                  Not well visualized  Heart:                 Appears normal         Upper Extremities:      Visualized                         (4CH, axis, and                         situs)  RVOT:                  Not well visualized    Lower Extremities:      Visualized  Other:  Parents do not wish to know sex of fetus. Feet visualized. Technically          difficult due to advanced gestational age. ---------------------------------------------------------------------- Cervix Uterus Adnexa  Cervix  Not visualized (advanced GA >24wks)  Adnexa  No abnormality visualized. ---------------------------------------------------------------------- Comments  This patient presented to the MAU as she has not had any  prenatal care.  She has a history of chronic hypertension and  schizophrenia.  Based on the fetal biometry measurements obtained today,  her [facility] is November 11, 2021, making her 33 weeks and 3 days  pregnant.  There was normal amniotic fluid noted on today's exam.  The limited fetal anatomy views appeared within normal  limits.  She should have another ultrasound in the MFM office in 3  weeks to confirm her dates ----------------------------------------------------------------------                   Johnell Comings, MD Electronically Signed Final Report   09/26/2021 01:44 pm ----------------------------------------------------------------------  MDM UA, UC UDS OB Panel Hgb A1C CMP, Protein/creat ratio Patient declined vaginal swabs while in MAU  [place] MFM OB Comp +14 weeks  Patient with significant tangential thinking and talking in circles about unsual topics. Patient denies any psychiatric history or medication but by chart review, patient was seen in April of 2021 and diagnosed with schizophrenia. She denies taking any medications at this time and denies this diagnosis.   Discussed importance of prenatal care with patient and partner.  Patient states she has pregnancy medicaid and no barriers to being able to come to prenatal care. Urgent message sent to establish new OB appointment ASAP.   Assessment and Plan   1. Nausea/vomiting in pregnancy   2. No prenatal care in current pregnancy in third trimester   3. [redacted] weeks gestation of pregnancy   4. Chronic hypertension in pregnancy    -Discharge home in stable condition -Third trimester precautions discussed -Patient advised to follow-up with OB to establish prenatal care, message sent -Patient may return to MAU as needed or if her condition were to change or worsen    Wende Mott CNM 09/26/2021, 10:55 AM

## 2021-09-26 NOTE — Discharge Instructions (Signed)

## 2021-09-27 LAB — CULTURE, OB URINE

## 2021-09-27 LAB — RUBELLA SCREEN: Rubella: 2.14 index (ref >0.99)

## 2021-09-28 ENCOUNTER — Telehealth: Payer: Self-pay

## 2021-09-28 ENCOUNTER — Encounter: Payer: Self-pay | Admitting: Family Medicine

## 2021-09-28 DIAGNOSIS — O0990 Supervision of high risk pregnancy, unspecified, unspecified trimester: Secondary | ICD-10-CM

## 2021-09-28 NOTE — Telephone Encounter (Addendum)
Patient did not keep appt this AM. Per chart review, needs MFM [place] in 2 weeks. Scheduled for 3:30 PM on 10/17/21. Called to notify patient. Pt states she tried to call office yesterday and was unable to get through. States her appt was too soon after discharge from hospital. Pt agreeable to rescheduling appt with 2 HR GTT. [place] with front office notified. ?

## 2021-09-28 NOTE — Progress Notes (Signed)
Patient did not keep appointment today. She will be called to reschedule.  

## 2021-10-08 IMAGING — CT CT CHEST W/ CM
2 of 4 series · 15 of 36 positions shown, 18 images · IV contrast (APPLIED)
Comparison: Today's chest radiograph and neck CTs, dictated
separately.

CLINICAL DATA: Altered mental status. Confusion. Neck CT
demonstrating pneumomediastinum.

EXAM:
CT CHEST WITH CONTRAST
TECHNIQUE: Multidetector CT imaging of the chest was performed during
intravenous contrast administration.
CONTRAST:  75mL OMNIPAQUE IOHEXOL 300 MG/ML  SOLN

[Series 1: chest w · axial · 0.61mm/px · z∈[-444,-196]mm · 12 of 146 slices shown, 15 images]
[im 11/146  mediastinal]
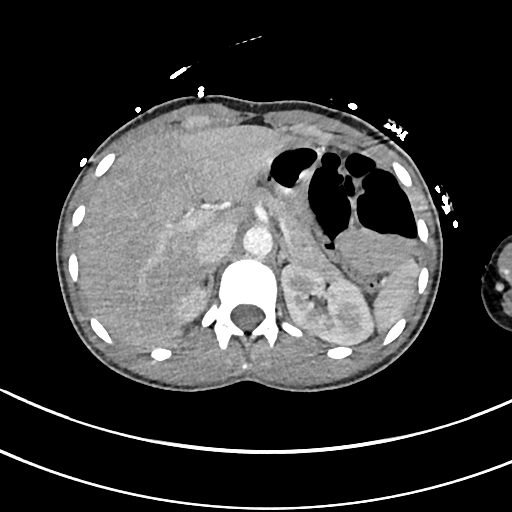
[im 11/146  lung]
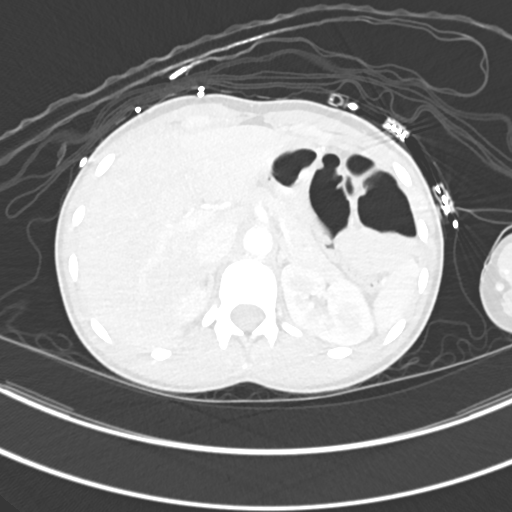
[im 21/146  lung]
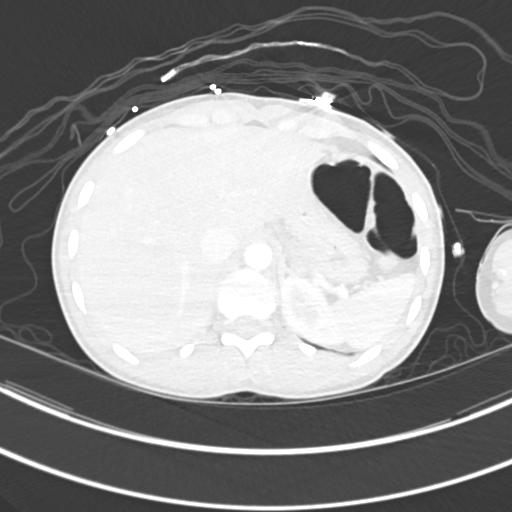
[im 32/146  lung]
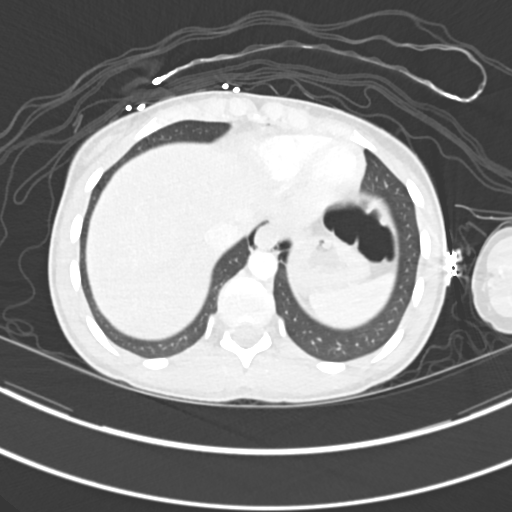
[im 42/146  lung]
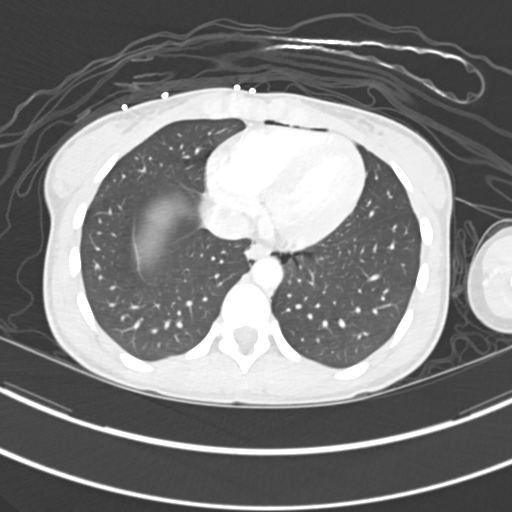
[im 52/146  mediastinal]
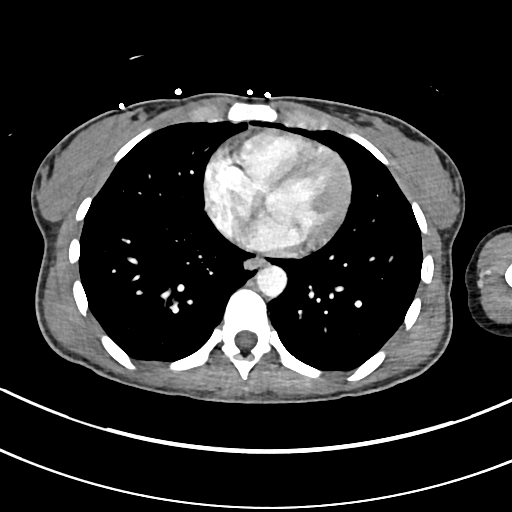
[im 52/146  lung]
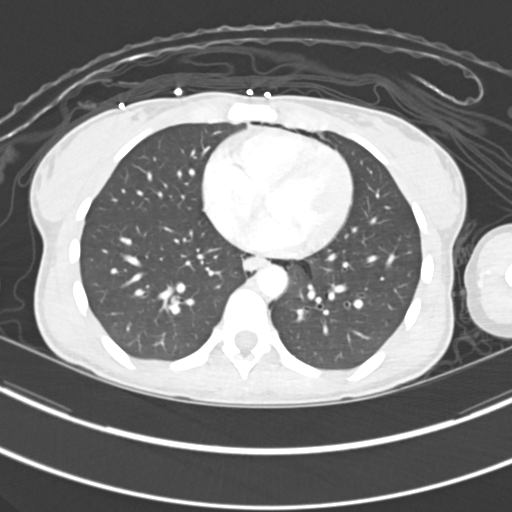
[im 63/146  lung]
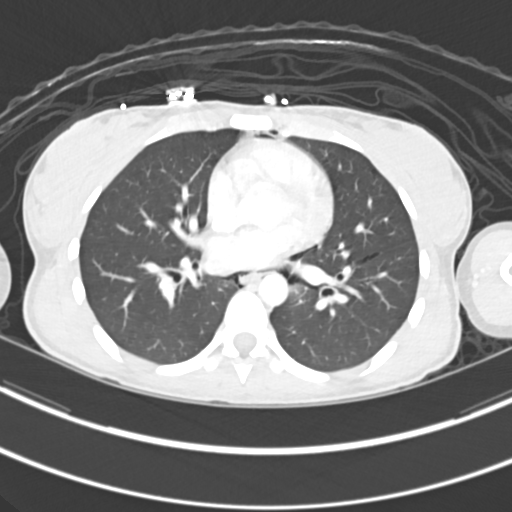
[im 83/146  lung]
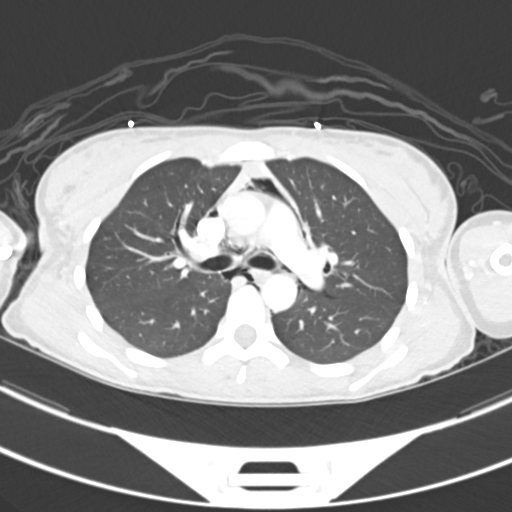
[im 94/146  lung]
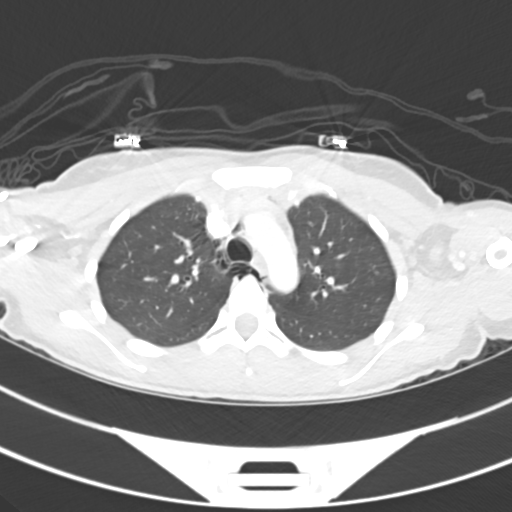
[im 104/146  mediastinal]
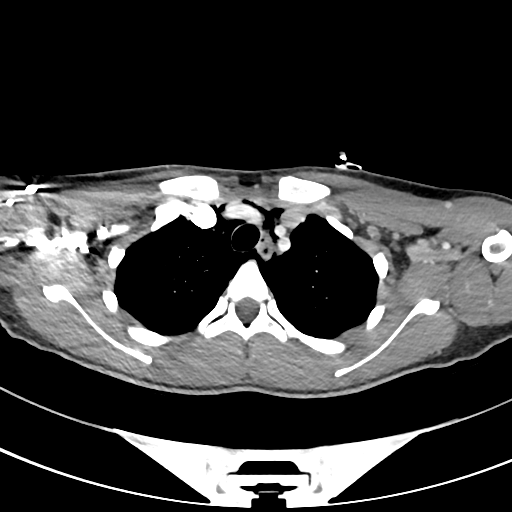
[im 104/146  lung]
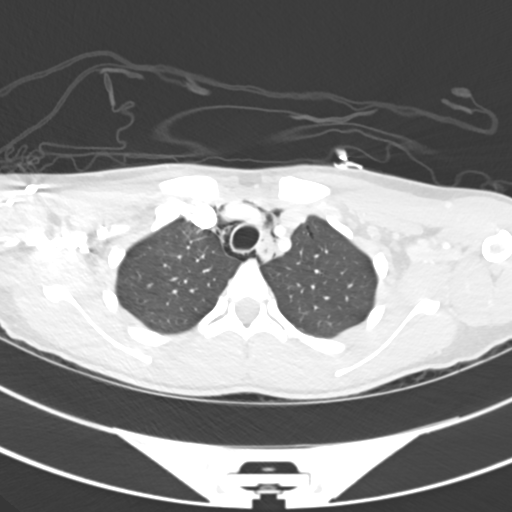
[im 114/146  lung]
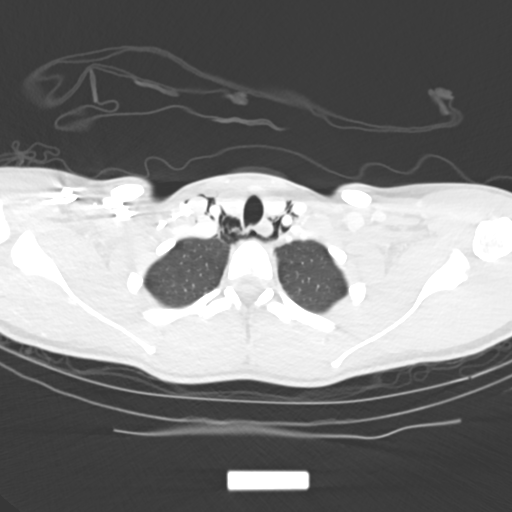
[im 125/146  lung]
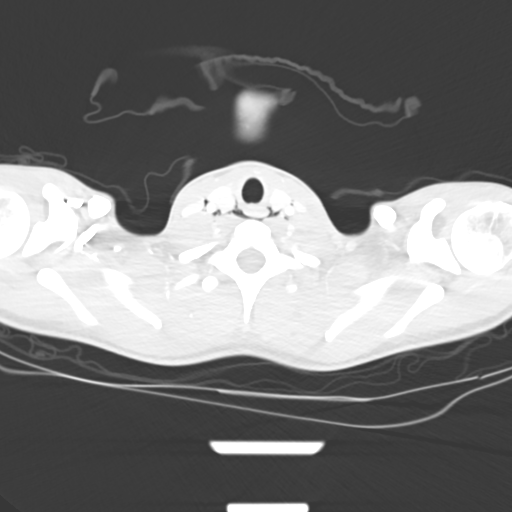
[im 135/146  lung]
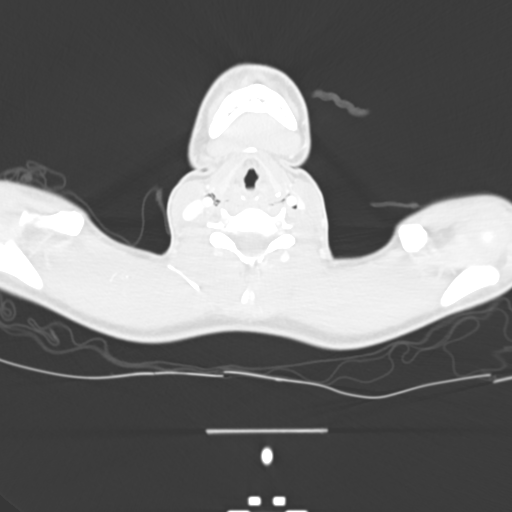

[Series 6: cor · coronal · 0.57mm/px · 3 of 120 slices shown]
[im 24/120  lung]
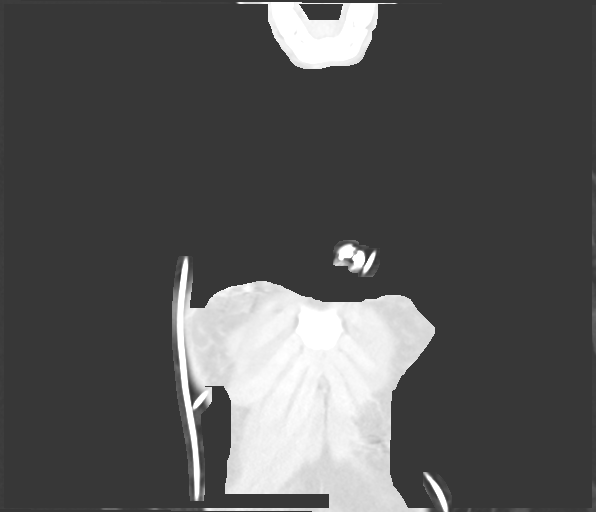
[im 48/120  lung]
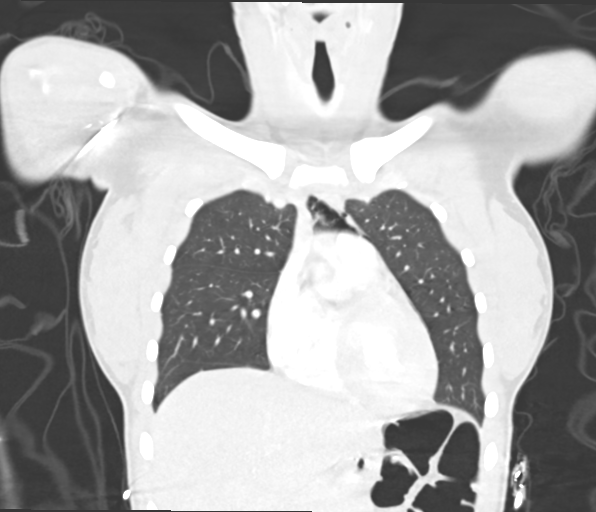
[im 72/120  lung]
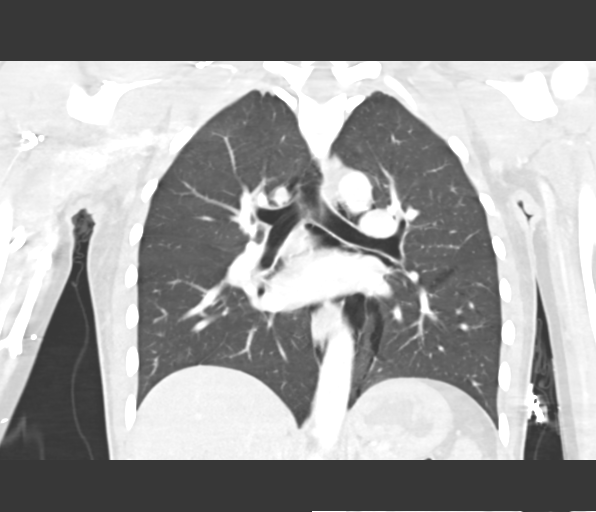

[15 of 36 positions shown; findings below may reference images not displayed]

FINDINGS: Cardiovascular: Normal aortic caliber. Normal heart size, without
pericardial effusion.

Mediastinum/Nodes: No mediastinal or hilar adenopathy. Soft tissue
density in the anterior mediastinum is likely residual thymus.

Lungs/Pleura: No pleural fluid. No pneumothorax. Extensive
pneumomediastinum within the low neck and throughout the chest. This
extends minimally along the left major fissure.

Clear lungs.

Upper Abdomen: Focal steatosis adjacent the falciform ligament.
Normal imaged portions of the spleen, stomach, pancreas,
gallbladder, adrenal glands, kidneys.

Musculoskeletal: No acute osseous abnormality.
IMPRESSION: Extensive pneumomediastinum, without cause identified

## 2021-10-09 IMAGING — DX DG ABD PORTABLE 1V
1 series · 1 of 1 positions shown · non-contrast
Comparison: None.

CLINICAL DATA: Pneumomediastinum.

EXAM:
PORTABLE ABDOMEN - 1 VIEW

[abdomen kub]
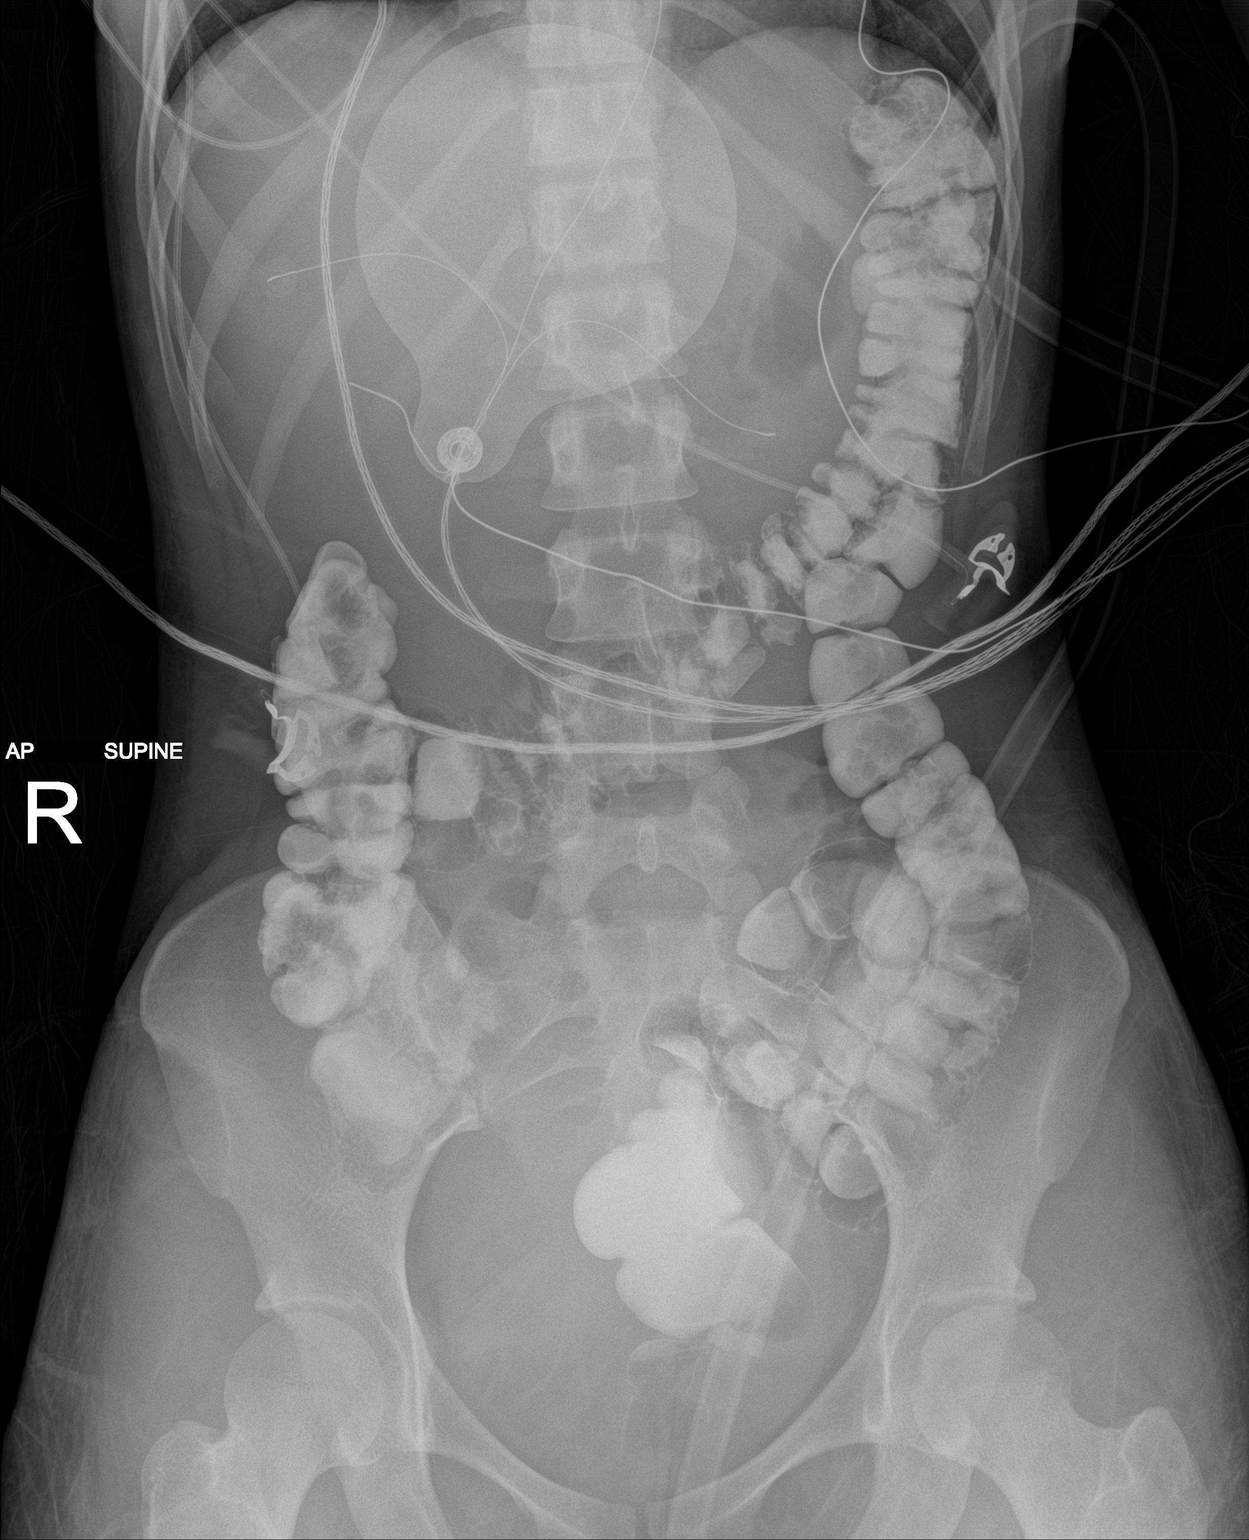

[1 of 1 positions shown; findings below may reference images not displayed]

FINDINGS: The bowel gas pattern is normal. Residual contrast is noted in the
colon. No radio-opaque calculi or other significant radiographic
abnormality are seen.
IMPRESSION: No evidence of bowel obstruction or ileus.

## 2021-10-11 ENCOUNTER — Other Ambulatory Visit: Payer: Self-pay | Admitting: General Practice

## 2021-10-11 ENCOUNTER — Other Ambulatory Visit: Payer: Medicaid Other

## 2021-10-11 ENCOUNTER — Encounter: Payer: Medicaid Other | Admitting: Obstetrics and Gynecology

## 2021-10-11 DIAGNOSIS — O0990 Supervision of high risk pregnancy, unspecified, unspecified trimester: Secondary | ICD-10-CM

## 2021-10-17 ENCOUNTER — Ambulatory Visit: Payer: Medicaid Other | Attending: Family Medicine

## 2021-10-17 ENCOUNTER — Ambulatory Visit: Payer: Medicaid Other

## 2021-11-12 ENCOUNTER — Inpatient Hospital Stay (HOSPITAL_COMMUNITY)
Admission: AD | Admit: 2021-11-12 | Discharge: 2021-11-14 | DRG: 769 | Disposition: A | Discharge status: Home / Self Care | Payer: Medicaid Other | Attending: Obstetrics and Gynecology | Admitting: Obstetrics and Gynecology

## 2021-11-12 DIAGNOSIS — O99345 Other mental disorders complicating the puerperium: Secondary | ICD-10-CM | POA: Diagnosis present

## 2021-11-12 DIAGNOSIS — F1210 Cannabis abuse, uncomplicated: Secondary | ICD-10-CM | POA: Clinically undetermined

## 2021-11-12 DIAGNOSIS — F209 Schizophrenia, unspecified: Secondary | ICD-10-CM | POA: Diagnosis present

## 2021-11-12 DIAGNOSIS — O99344 Other mental disorders complicating childbirth: Secondary | ICD-10-CM | POA: Diagnosis not present

## 2021-11-12 DIAGNOSIS — O1003 Pre-existing essential hypertension complicating the puerperium: Secondary | ICD-10-CM | POA: Diagnosis present

## 2021-11-12 DIAGNOSIS — O99325 Drug use complicating the puerperium: Secondary | ICD-10-CM | POA: Diagnosis present

## 2021-11-12 DIAGNOSIS — O1002 Pre-existing essential hypertension complicating childbirth: Secondary | ICD-10-CM | POA: Diagnosis not present

## 2021-11-12 DIAGNOSIS — Z3A40 40 weeks gestation of pregnancy: Secondary | ICD-10-CM | POA: Diagnosis not present

## 2021-11-12 DIAGNOSIS — S3141XA Laceration without foreign body of vagina and vulva, initial encounter: Secondary | ICD-10-CM | POA: Diagnosis not present

## 2021-11-12 DIAGNOSIS — O480 Post-term pregnancy: Secondary | ICD-10-CM | POA: Diagnosis not present

## 2021-11-12 DIAGNOSIS — O99324 Drug use complicating childbirth: Secondary | ICD-10-CM | POA: Diagnosis not present

## 2021-11-12 LAB — DIFFERENTIAL
Abs Immature Granulocytes: 0.24 10*3/uL — ABNORMAL HIGH (ref 0.00–0.07)
Basophils Absolute: 0.1 10*3/uL (ref 0.0–0.1)
Basophils Relative: 0 %
Eosinophils Absolute: 0 10*3/uL (ref 0.0–0.5)
Eosinophils Relative: 0 %
Immature Granulocytes: 1 %
Lymphocytes Relative: 2 %
Lymphs Abs: 0.8 10*3/uL (ref 0.7–4.0)
Monocytes Absolute: 1.5 10*3/uL — ABNORMAL HIGH (ref 0.1–1.0)
Monocytes Relative: 5 %
Neutro Abs: 30.1 10*3/uL — ABNORMAL HIGH (ref 1.7–7.7)
Neutrophils Relative %: 92 %

## 2021-11-12 LAB — CBC
HCT: 37.6 % (ref 36.0–46.0)
HCT: 34.6 % — ABNORMAL LOW (ref 36.0–46.0)
Hemoglobin: 12.4 g/dL (ref 12.0–15.0)
Hemoglobin: 11.1 g/dL — ABNORMAL LOW (ref 12.0–15.0)
MCH: 27.3 pg (ref 26.0–34.0)
MCH: 26.6 pg (ref 26.0–34.0)
MCHC: 33 g/dL (ref 30.0–36.0)
MCHC: 32.1 g/dL (ref 30.0–36.0)
MCV: 82.8 fL (ref 80.0–100.0)
MCV: 82.8 fL (ref 80.0–100.0)
Platelets: 372 10*3/uL (ref 150–400)
Platelets: 418 10*3/uL — ABNORMAL HIGH (ref 150–400)
RBC: 4.54 MIL/uL (ref 3.87–5.11)
RBC: 4.18 MIL/uL (ref 3.87–5.11)
RDW: 14.6 % (ref 11.5–15.5)
RDW: 14.6 % (ref 11.5–15.5)
WBC: 32.7 10*3/uL — ABNORMAL HIGH (ref 4.0–10.5)
WBC: 34 10*3/uL — ABNORMAL HIGH (ref 4.0–10.5)
nRBC: 0 % (ref 0.0–0.2)
nRBC: 0 % (ref 0.0–0.2)

## 2021-11-12 LAB — APTT: aPTT: 28 seconds (ref 24–36)

## 2021-11-12 LAB — COMPREHENSIVE METABOLIC PANEL
ALT: 15 U/L (ref 0–44)
AST: 23 U/L (ref 15–41)
Albumin: 3.4 g/dL — ABNORMAL LOW (ref 3.5–5.0)
Alkaline Phosphatase: 157 U/L — ABNORMAL HIGH (ref 38–126)
Anion gap: 10 (ref 5–15)
BUN: 7 mg/dL (ref 6–20)
CO2: 19 mmol/L — ABNORMAL LOW (ref 22–32)
Calcium: 9.6 mg/dL (ref 8.9–10.3)
Chloride: 105 mmol/L (ref 98–111)
Creatinine, Ser: 0.69 mg/dL (ref 0.44–1.00)
GFR, Estimated: >60 mL/min (ref >60)
Glucose, Bld: 88 mg/dL (ref 70–99)
Potassium: 4.2 mmol/L (ref 3.5–5.1)
Sodium: 134 mmol/L — ABNORMAL LOW (ref 135–145)
Total Bilirubin: 0.4 mg/dL (ref 0.3–1.2)
Total Protein: 7.4 g/dL (ref 6.5–8.1)

## 2021-11-12 LAB — RAPID HIV SCREEN (HIV 1/2 AB+AG)
HIV 1/2 Antibodies: NONREACTIVE
HIV-1 P24 Antigen - HIV24: NONREACTIVE

## 2021-11-12 LAB — FIBRINOGEN: Fibrinogen: 595 mg/dL — ABNORMAL HIGH (ref 210–475)

## 2021-11-12 LAB — PROTIME-INR
INR: 1.1 (ref 0.8–1.2)
Prothrombin Time: 13.8 seconds (ref 11.4–15.2)

## 2021-11-12 LAB — TYPE AND SCREEN
ABO/RH(D): A POS
Antibody Screen: NEGATIVE

## 2021-11-12 MED ORDER — LORAZEPAM 2 MG/ML IJ SOLN
1.0000 mg | Freq: Once | INTRAMUSCULAR | Status: AC
  Administered 2021-11-12: 1 mg via INTRAVENOUS
  Filled 2021-11-12: qty 1

## 2021-11-12 MED ORDER — FENTANYL CITRATE (PF) 100 MCG/2ML IJ SOLN
INTRAMUSCULAR | Status: AC
  Administered 2021-11-12: 100 ug
  Filled 2021-11-12: qty 2

## 2021-11-12 MED ORDER — TRANEXAMIC ACID-NACL 1000-0.7 MG/100ML-% IV SOLN: 1000.0000 mg | Freq: Once | INTRAVENOUS | Status: DC | PRN

## 2021-11-12 MED ORDER — OXYCODONE-ACETAMINOPHEN 5-325 MG PO TABS: 1.0000 | ORAL_TABLET | ORAL | Status: DC | PRN

## 2021-11-12 MED ORDER — OXYTOCIN 10 UNIT/ML IJ SOLN
INTRAMUSCULAR | Status: AC
  Filled 2021-11-12: qty 1

## 2021-11-12 MED ORDER — SOD CITRATE-CITRIC ACID 500-334 MG/5ML PO SOLN: 30.0000 mL | ORAL | Status: DC | PRN

## 2021-11-12 MED ORDER — LACTATED RINGERS IV SOLN: 500.0000 mL | INTRAVENOUS | Status: DC | PRN

## 2021-11-12 MED ORDER — ACETAMINOPHEN 325 MG PO TABS: 650.0000 mg | ORAL_TABLET | ORAL | Status: DC | PRN

## 2021-11-12 MED ORDER — OXYTOCIN 10 UNIT/ML IJ SOLN: 10.0000 [IU] | Freq: Once | INTRAMUSCULAR | Status: DC

## 2021-11-12 MED ORDER — LACTATED RINGERS IV SOLN: INTRAVENOUS | Status: DC

## 2021-11-12 MED ORDER — LACTATED RINGERS IV BOLUS
1500.0000 mL | Freq: Once | INTRAVENOUS | Status: AC
  Administered 2021-11-12: 1500 mL via INTRAVENOUS

## 2021-11-12 MED ORDER — OXYCODONE-ACETAMINOPHEN 5-325 MG PO TABS: 2.0000 | ORAL_TABLET | ORAL | Status: DC | PRN

## 2021-11-12 MED ORDER — METHYLERGONOVINE MALEATE 0.2 MG/ML IJ SOLN
INTRAMUSCULAR | Status: AC
  Administered 2021-11-12: 0.2 mg
  Filled 2021-11-12: qty 1

## 2021-11-12 MED ORDER — TRANEXAMIC ACID-NACL 1000-0.7 MG/100ML-% IV SOLN
1000.0000 mg | INTRAVENOUS | Status: AC
  Administered 2021-11-12: 1000 mg via INTRAVENOUS
  Filled 2021-11-12: qty 100

## 2021-11-12 MED ORDER — OXYTOCIN-SODIUM CHLORIDE 30-0.9 UT/500ML-% IV SOLN
2.5000 [IU]/h | INTRAVENOUS | Status: DC
Start: 2021-11-12 — End: 2021-11-12

## 2021-11-12 MED ORDER — LIDOCAINE HCL (PF) 1 % IJ SOLN
30.0000 mL | INTRAMUSCULAR | Status: AC | PRN
  Administered 2021-11-12: 30 mL via SUBCUTANEOUS
  Filled 2021-11-12: qty 30

## 2021-11-12 MED ORDER — FLEET ENEMA 7-19 GM/118ML RE ENEM: 1.0000 | ENEMA | RECTAL | Status: DC | PRN

## 2021-11-12 MED ORDER — OXYTOCIN-SODIUM CHLORIDE 30-0.9 UT/500ML-% IV SOLN
INTRAVENOUS | Status: AC
  Filled 2021-11-12: qty 500

## 2021-11-12 MED ORDER — ONDANSETRON HCL 4 MG/2ML IJ SOLN
4.0000 mg | Freq: Four times a day (QID) | INTRAMUSCULAR | Status: DC | PRN
  Administered 2021-11-13: 4 mg via INTRAVENOUS

## 2021-11-12 MED ORDER — MISOPROSTOL 200 MCG PO TABS
ORAL_TABLET | ORAL | Status: AC
  Administered 2021-11-12: 1000 ug via RECTAL
  Filled 2021-11-12: qty 5

## 2021-11-12 MED ORDER — OXYTOCIN BOLUS FROM INFUSION
333.0000 mL | Freq: Once | INTRAVENOUS | Status: DC
Start: 2021-11-12 — End: 2021-11-12

## 2021-11-12 NOTE — Progress Notes (Signed)
Susan Morgan is a 21 y.o. G1P1. She presented to [facility] via EMS following precipitous delivery of her baby and her placenta at home today. Patient was met by CNM , MAU Charge RN and MAU Ed Tech patient. She was screened and assisted via stretcher to L&D.  ? ?Patient desires lotus birth and declines clamping or cutting cord.  ? ?O ?There were no vitals taken for this visit. ? ?Physical Exam ?Vitals and nursing note reviewed. Exam conducted with a chaperone present.  ?Constitutional:   ?   Appearance: Normal appearance. She is obese. She is not ill-appearing.  ?Cardiovascular:  ?   Rate and Rhythm: Normal rate.  ?   Pulses: Normal pulses.  ?Pulmonary:  ?   Effort: Pulmonary effort is normal.  ?Abdominal:  ?   Comments: Gravid  ?Skin: ?   Capillary Refill: Capillary refill takes less than 2 seconds.  ?Neurological:  ?   Mental Status: She is alert and oriented to person, place, and time.  ?Psychiatric:     ?   Mood and Affect: Mood normal.     ?   Behavior: Behavior normal.     ?   Thought Content: Thought content normal.     ?   Judgment: Judgment normal.  ? ? ?A ?Medical screening exam complete ?Patient with small bleeding, fundus firm ?Patient agreeable to blood draw but declines all other interventions  ? ?Patient verbalized preference for lotus birth. Risks of lotus birth were discussed including newborn infection and possibility of longer hospital stay to allow for assessment of newborn sepsis by Pediatrics team. Patient verbalized understanding of possible impact to newborn health and assumption of risks. Patient refused cutting of umbilical cord today. Discussion witness by RN. Patient has signed a form indicating that this process is against medical advice (AMA).   ? ?P ?Admit to L&D ? ?Darlina Rumpf, [place] ?11/12/2021 8:47 PM  ? ? ?

## 2021-11-12 NOTE — Progress Notes (Addendum)
OB Note ?Patient unable to get comfortable with in room anesthesia and still bleeding s/p 2nd degree repair.  ? ?D/w pt recommend exam under anesthesia in the OR and she is amenable to this. Pt last ate at 1500 (strawberries) ? ? ?  Latest Ref Rng & Units 11/12/2021  ?  8:47 PM 11/12/2021  ?  6:54 PM 09/26/2021  ? 11:19 AM  ?CBC  ?WBC 4.0 - 10.5 K/uL 34.0   32.7   15.5    ?Hemoglobin 12.0 - 15.0 g/dL 62.9   52.8   41.3    ?Hematocrit 36.0 - 46.0 % 34.6   37.6   34.4    ?Platelets 150 - 400 K/uL 418   372   378    ? ?Normal fibrinogen and coags ? ?Cornelia Copa MD ?Attending ?[facility]) ?11/12/2021 ?Time: 2330 ? ?

## 2021-11-12 NOTE — Progress Notes (Signed)
L&D Note ?CNM Jeronimo Greaves was called away so I went to check on the patient. She is now also refuses IM pitocin in addition to starting an IV. She states she does not want a female provider and states she only came in because the FOB and her friend told her to come in to get checked out along with the baby. ?ebl 364mL on triton and patient just passed a big clot. No one has assessed her perineum. Pt is sitting on the toilet and I explained to her the rationale for assessing her and making sure her bleeding is okay. Labs just drawn and patient states she may allow female CNM to assess her ? ?Durene Romans MD ?Attending ?[facility] [employer] [facility]) ?11/12/2021 ?Time: 1924 ? ?

## 2021-11-12 NOTE — Progress Notes (Signed)
Patient ID: Susan Morgan, female   DOB: [birth year], 21 y.o.   MRN: [id number] ? ?In to assess patient's bleeding. Per RN, patient passed several large blood clots and felt faint while returning to the bed. Patient does not have an IV and is refusing IM Pitocin. I tried to assess bleeding, I did make 1 attempt, however patient was too uncomfortable. She is refusing to let me continue with my assessment. I offered for patient to have an IV started and give IV pain meds along with IV Pitocin prior to assessment and patient still declining. I discussed the risks of hemorrhage and infection if I am unable to assess bleeding or lacerations. She continues to refuse until she is able to clean herself. I offered to let patient clean the blood from her vagina while she is lying in bed given feeling faint and then the night shift team will assess further. She is agreeable to this plan. CBC repeated given current EBL of almost 500cc since patient arrived.  ? ?

## 2021-11-12 NOTE — H&P (Signed)
Obstetrics Admission History & Physical  ?11/12/2021 - 7:28 PM ?Primary OBGYN: None ? ?Chief Complaint: s/p home birth.   ?History of Present Illness  ?21 y.o. G1P1001 s/p vag delivery at home at  [redacted]w[redacted]d (Dating: 32-33wk u/s), with the above CC. Pregnancy complicated by: cHTN, schizophrenia. ? ?Patient had a birth at home (?home birth vs precipitous delivery) and was advised to come in by the FOB and friends to ensure that she's okay the baby is okay.  ? ?Patient declined IV.  ? ?She was seen in the MAU approx 2 months and had OB labs and a dating u/s ? ?Review of Systems: as noted in the History of Present Illness. ? ?Patient Active Problem List  ? Diagnosis Date Noted  ? Vaginal delivery 11/12/2021  ? Marijuana abuse 11/12/2021  ? Supervision of high risk pregnancy, antepartum 09/28/2021  ? Chronic hypertension in pregnancy 09/26/2021  ? Schizophreniform disorder (HCC) 11/15/2019  ? Pneumomediastinum (HCC) 11/14/2019  ? Acute psychosis (HCC) 11/14/2019  ? Acute kidney injury (HCC) 11/14/2019  ?  ? ?PMHx:  ?Past Medical History:  ?Diagnosis Date  ? Hypertension   ? Mental disorder   ? ?PSHx:  ?Past Surgical History:  ?Procedure Laterality Date  ? NO PAST SURGERIES    ? ?Medications: unknown ? ? ?Allergies: has No Known Allergies. ?OBHx:  ?OB History  ?Gravida Para Term Preterm AB Living  ?1            ?SAB IAB Ectopic Multiple Live Births  ?           ?  ?# Outcome Date GA Lbr Len/2nd Weight Sex Delivery Anes PTL Lv  ?1 Current           ? ?       ?FHx: No family history on file. ?Soc Hx:  ?Social History  ? ?Socioeconomic History  ? Marital status: Single  ?  Spouse name: Not on file  ? Number of children: Not on file  ? Years of education: Not on file  ? Highest education level: Not on file  ?Occupational History  ? Not on file  ?Tobacco Use  ? Smoking status: Never  ? Smokeless tobacco: Never  ?Vaping Use  ? Vaping Use: Never used  ?Substance and Sexual Activity  ? Alcohol use: Never  ? Drug use: Never  ? Sexual  activity: Yes  ?Other Topics Concern  ? Not on file  ?Social History Narrative  ? Not on file  ? ?Social Determinants of Health  ? ?Financial Resource Strain: Not on file  ?Food Insecurity: Not on file  ?Transportation Needs: Not on file  ?Physical Activity: Not on file  ?Stress: Not on file  ?Social Connections: Not on file  ?Intimate Partner Violence: Not on file  ? ? ?Objective  ?Patient is declining vital signs ? ? ?General: Well nourished, well developed female in no acute distress. Patient sitting on the toilet ?Neuro/Psych:  argumentative ? ?Labs  ?Pending: t&s, rapid hiv, cmp, rpr ? ?Recent Labs  ?Lab 11/12/21 ?1854  ?WBC 32.7*  ?HGB 12.4  ?HCT 37.6  ?PLT 372  ? ? ? ?Radiology ?No new imaging ? ?Assessment & Plan  ? 21 y.o. G1P1001 s/p birth at home at term; pt stable ?CNM Reita Cliche was called away so I went to check on the patient. She is now also refuses IM pitocin in addition to starting an IV. She states she does not want a female provider and states she only came in because  the FOB and her friend told her to come in to get checked out along with the baby. ?ebl on triton and patient just passed a big clot. No one has assessed her perineum. Pt is sitting on the toilet and I explained to her the rationale for assessing her and making sure her bleeding is okay. Labs just drawn and patient states she may allow female CNM to assess her ? ?CBC that just resulted is reassuring ? ?Cornelia Copa MD ?Attending ?[facility]) ? ?

## 2021-11-12 NOTE — Progress Notes (Signed)
Assessed patient at bedside to evaluate perineal laceration. 2nd degree perineal laceration present. 1 mg of Ativan given prior to starting repair due to significant anxiety regarding procedure. 1% lidocaine injected locally to achieve appropriate analgesia. Laceration was repaired with 3-0 Vicryl. Slight oozing noted from laceration after repair. Improved with holding pressure. Packing placed in vaginal to help with hemostasis. Will reassess in 30 minutes prior to transfer to postpartum.  ? ?Evalina Field, MD ?

## 2021-11-12 NOTE — Progress Notes (Addendum)
Patient ID: Susan Morgan, female   DOB: [birth year], 21 y.o.   MRN: [id number] ? ?Patient [name] is a 21 y.o. G1P0 ?Here s/p home birth at 1700 today, unassisted. Baby was caught by FOB on bathroom floor. FOB was concerned about bleeding so was brought in by EMS.  ? ?I arrived at the bedside at 2005 to relieve outgoing CNM; RNs attempting to get IV access and patient was requesting no IV. SImpsonCNM unable to visualize perineum or clean blood off labia as patient was refusing all attempts at exam.  Dr. Vergie Living was updated at 2010 by me. I had a long discussion with patient about importance of IV, as well as need for IM pitocin after her blood pressure was 77/46. Patient consented for IV LR bolus, IM methergine and TXA as well as two doses of fentanyl. Patinet continued to expel clots with fundal rubs and refused exam; discussed that her VSS were concerning, I don't know the source of the bleeding and patient agreed to rectal cytotec. EBL at this point was 376, exluding blood loss at home and saturated laps on delivery table.  ? ?2100: Called Dr. Mathis Fare to attempt repair; will try 1 mg of ativan for repair; bleeding has slowed and fundus is firm.  ? ?Blood work was repeated, Hgb dropped from admission 12.4 to 11.1; Fibrinogen 574 and platelets increased from 372>418/.  ? ? ?Patient Vitals for the past 24 hrs: ? BP Pulse SpO2  ?11/12/21 2303 132/69 (!) 123 --  ?11/12/21 2300 -- -- 100 %  ?11/12/21 2246 120/67 (!) 107 --  ?11/12/21 2245 -- -- 100 %  ?11/12/21 2231 125/75 (!) 108 --  ?11/12/21 2230 128/75 (!) 131 --  ?11/12/21 2200 120/70 98 97 %  ?11/12/21 2118 (!) 145/92 97 --  ?11/12/21 2115 -- -- 100 %  ?11/12/21 2110 -- -- 100 %  ?11/12/21 2105 -- -- 100 %  ?11/12/21 2101 118/83 (!) 103 --  ?11/12/21 2100 -- -- (!) 82 %  ?11/12/21 2055 96/81 72 --  ?11/12/21 2050 -- -- 96 %  ?11/12/21 2046 (!) 77/47 (!) 128 --  ?11/12/21 2045 -- -- 98 %  ?11/12/21 2017 95/70 (!) 139 --  ?11/12/21 2016 95/70 (!)  139 --  ?11/12/21 2002 -- (!) 145 --  ?11/12/21 1946 100/67 (!) 133 --  ?11/12/21 1933 123/70 (!) 114 --  ? ? ?

## 2021-11-13 ENCOUNTER — Inpatient Hospital Stay (HOSPITAL_COMMUNITY): Payer: Medicaid Other | Admitting: Anesthesiology

## 2021-11-13 ENCOUNTER — Encounter (HOSPITAL_COMMUNITY): Payer: Self-pay | Admitting: Obstetrics and Gynecology

## 2021-11-13 ENCOUNTER — Encounter (HOSPITAL_COMMUNITY)
Admission: AD | Disposition: A | Discharge status: Home / Self Care | Payer: Self-pay | Source: Home / Self Care | Attending: Obstetrics and Gynecology

## 2021-11-13 DIAGNOSIS — F23 Brief psychotic disorder: Secondary | ICD-10-CM

## 2021-11-13 DIAGNOSIS — S3141XA Laceration without foreign body of vagina and vulva, initial encounter: Secondary | ICD-10-CM

## 2021-11-13 HISTORY — PX: PERINEAL LACERATION REPAIR: SHX5389

## 2021-11-13 LAB — CBC
HCT: 23 % — ABNORMAL LOW (ref 36.0–46.0)
HCT: 21.6 % — ABNORMAL LOW (ref 36.0–46.0)
Hemoglobin: 7.7 g/dL — ABNORMAL LOW (ref 12.0–15.0)
Hemoglobin: 7.1 g/dL — ABNORMAL LOW (ref 12.0–15.0)
MCH: 27.3 pg (ref 26.0–34.0)
MCH: 27.1 pg (ref 26.0–34.0)
MCHC: 33.5 g/dL (ref 30.0–36.0)
MCHC: 32.9 g/dL (ref 30.0–36.0)
MCV: 81.6 fL (ref 80.0–100.0)
MCV: 82.4 fL (ref 80.0–100.0)
Platelets: 276 10*3/uL (ref 150–400)
Platelets: 296 10*3/uL (ref 150–400)
RBC: 2.82 MIL/uL — ABNORMAL LOW (ref 3.87–5.11)
RBC: 2.62 MIL/uL — ABNORMAL LOW (ref 3.87–5.11)
RDW: 14.6 % (ref 11.5–15.5)
RDW: 14.6 % (ref 11.5–15.5)
WBC: 24.8 10*3/uL — ABNORMAL HIGH (ref 4.0–10.5)
WBC: 26.9 10*3/uL — ABNORMAL HIGH (ref 4.0–10.5)
nRBC: 0 % (ref 0.0–0.2)
nRBC: 0 % (ref 0.0–0.2)

## 2021-11-13 LAB — RPR: RPR Ser Ql: NONREACTIVE

## 2021-11-13 SURGERY — SUTURE REPAIR, LACERATION, PERINEUM
Anesthesia: Spinal | Site: Perineum | Wound class: Clean Contaminated

## 2021-11-13 MED ORDER — TETANUS-DIPHTH-ACELL PERTUSSIS 5-2.5-18.5 LF-MCG/0.5 IM SUSY: 0.5000 mL | PREFILLED_SYRINGE | Freq: Once | INTRAMUSCULAR | Status: DC

## 2021-11-13 MED ORDER — FENTANYL CITRATE (PF) 100 MCG/2ML IJ SOLN
INTRAMUSCULAR | Status: DC | PRN
  Administered 2021-11-13 (×2): 50 ug via INTRAVENOUS

## 2021-11-13 MED ORDER — ONDANSETRON HCL 4 MG/2ML IJ SOLN: 4.0000 mg | Freq: Once | INTRAMUSCULAR | Status: DC | PRN

## 2021-11-13 MED ORDER — DEXAMETHASONE SODIUM PHOSPHATE 10 MG/ML IJ SOLN
INTRAMUSCULAR | Status: DC | PRN
  Administered 2021-11-13: 4 mg via INTRAVENOUS

## 2021-11-13 MED ORDER — OXYCODONE HCL 5 MG/5ML PO SOLN: 5.0000 mg | Freq: Once | ORAL | Status: DC | PRN

## 2021-11-13 MED ORDER — BENZOCAINE-MENTHOL 20-0.5 % EX AERO
1.0000 "application " | INHALATION_SPRAY | CUTANEOUS | Status: DC | PRN
  Administered 2021-11-13: 1 via TOPICAL
  Filled 2021-11-13: qty 56

## 2021-11-13 MED ORDER — KETOROLAC TROMETHAMINE 30 MG/ML IJ SOLN: 30.0000 mg | Freq: Once | INTRAMUSCULAR | Status: DC | PRN

## 2021-11-13 MED ORDER — LACTATED RINGERS IV SOLN: INTRAVENOUS | Status: DC

## 2021-11-13 MED ORDER — DIPHENHYDRAMINE HCL 25 MG PO CAPS: 25.0000 mg | ORAL_CAPSULE | Freq: Four times a day (QID) | ORAL | Status: DC | PRN

## 2021-11-13 MED ORDER — WITCH HAZEL-GLYCERIN EX PADS: 1.0000 "application " | MEDICATED_PAD | CUTANEOUS | Status: DC | PRN

## 2021-11-13 MED ORDER — LIDOCAINE HCL (PF) 1 % IJ SOLN
INTRAMUSCULAR | Status: AC
  Filled 2021-11-13: qty 30

## 2021-11-13 MED ORDER — OXYCODONE HCL 5 MG PO TABS: 5.0000 mg | ORAL_TABLET | Freq: Once | ORAL | Status: DC | PRN

## 2021-11-13 MED ORDER — FENTANYL CITRATE (PF) 100 MCG/2ML IJ SOLN
INTRAMUSCULAR | Status: AC
  Filled 2021-11-13: qty 2

## 2021-11-13 MED ORDER — SENNOSIDES-DOCUSATE SODIUM 8.6-50 MG PO TABS: 2.0000 | ORAL_TABLET | Freq: Every evening | ORAL | Status: DC | PRN

## 2021-11-13 MED ORDER — DIBUCAINE (PERIANAL) 1 % EX OINT: 1.0000 "application " | TOPICAL_OINTMENT | CUTANEOUS | Status: DC | PRN

## 2021-11-13 MED ORDER — MIDAZOLAM HCL 2 MG/2ML IJ SOLN
INTRAMUSCULAR | Status: AC
  Filled 2021-11-13: qty 2

## 2021-11-13 MED ORDER — DEXAMETHASONE SODIUM PHOSPHATE 4 MG/ML IJ SOLN
INTRAMUSCULAR | Status: AC
  Filled 2021-11-13: qty 1

## 2021-11-13 MED ORDER — ONDANSETRON HCL 4 MG PO TABS: 4.0000 mg | ORAL_TABLET | ORAL | Status: DC | PRN

## 2021-11-13 MED ORDER — SIMETHICONE 80 MG PO CHEW: 80.0000 mg | CHEWABLE_TABLET | ORAL | Status: DC | PRN

## 2021-11-13 MED ORDER — COCONUT OIL OIL
1.0000 "application " | TOPICAL_OIL | Status: DC | PRN
  Administered 2021-11-13: 1 via TOPICAL

## 2021-11-13 MED ORDER — MIDAZOLAM HCL 5 MG/5ML IJ SOLN
INTRAMUSCULAR | Status: DC | PRN
  Administered 2021-11-13: 2 mg via INTRAVENOUS

## 2021-11-13 MED ORDER — PRENATAL MULTIVITAMIN CH
1.0000 | ORAL_TABLET | Freq: Every day | ORAL | Status: DC
  Administered 2021-11-13 – 2021-11-14 (×2): 1 via ORAL
  Filled 2021-11-13 (×2): qty 1

## 2021-11-13 MED ORDER — IBUPROFEN 600 MG PO TABS
600.0000 mg | ORAL_TABLET | Freq: Four times a day (QID) | ORAL | Status: DC
  Administered 2021-11-13 – 2021-11-14 (×5): 600 mg via ORAL
  Filled 2021-11-13 (×5): qty 1

## 2021-11-13 MED ORDER — ALBUMIN HUMAN 5 % IV SOLN: INTRAVENOUS | Status: DC | PRN

## 2021-11-13 MED ORDER — ONDANSETRON HCL 4 MG/2ML IJ SOLN
INTRAMUSCULAR | Status: AC
  Filled 2021-11-13: qty 2

## 2021-11-13 MED ORDER — HYDROMORPHONE HCL 1 MG/ML IJ SOLN: 0.2500 mg | INTRAMUSCULAR | Status: DC | PRN

## 2021-11-13 MED ORDER — ALBUMIN HUMAN 5 % IV SOLN
INTRAVENOUS | Status: AC
  Filled 2021-11-13: qty 250

## 2021-11-13 MED ORDER — ONDANSETRON HCL 4 MG/2ML IJ SOLN: 4.0000 mg | INTRAMUSCULAR | Status: DC | PRN

## 2021-11-13 MED ORDER — ACETAMINOPHEN 325 MG PO TABS: 650.0000 mg | ORAL_TABLET | ORAL | Status: DC | PRN

## 2021-11-13 MED ORDER — STERILE WATER FOR IRRIGATION IR SOLN
Status: DC | PRN
  Administered 2021-11-13: 1

## 2021-11-13 SURGICAL SUPPLY — 19 items
CATH ROBINSON RED A/P 16FR (CATHETERS) ×3 IMPLANT
CONT PATH 16OZ SNAP LID 3702 (MISCELLANEOUS) ×3 IMPLANT
GLOVE BIOGEL PI IND STRL 7.0 (GLOVE) ×2 IMPLANT
GLOVE BIOGEL PI INDICATOR 7.0 (GLOVE) ×1
GLOVE SURG SS PI 7.0 STRL IVOR (GLOVE) ×3 IMPLANT
GLOVE SURG UNDER POLY LF SZ7.5 (GLOVE) ×3 IMPLANT
GOWN STRL REUS W/ TWL LRG LVL3 (GOWN DISPOSABLE) ×2 IMPLANT
GOWN STRL REUS W/ TWL XL LVL3 (GOWN DISPOSABLE) ×2 IMPLANT
GOWN STRL REUS W/TWL LRG LVL3 (GOWN DISPOSABLE) ×1
GOWN STRL REUS W/TWL XL LVL3 (GOWN DISPOSABLE) ×1
HIBICLENS CHG 4% 4OZ BTL (MISCELLANEOUS) ×3 IMPLANT
NS IRRIG 1000ML POUR BTL (IV SOLUTION) ×3 IMPLANT
PACK VAGINAL MINOR WOMEN LF (CUSTOM PROCEDURE TRAY) ×3 IMPLANT
PAD OB MATERNITY 4.3X12.25 (PERSONAL CARE ITEMS) ×3 IMPLANT
PAD PREP 24X48 CUFFED NSTRL (MISCELLANEOUS) ×3 IMPLANT
SUT CHROMIC 2 0 CT 1 (SUTURE) ×2 IMPLANT
SUT VIC AB 3-0 CT1 27 (SUTURE) ×1
SUT VIC AB 3-0 CT1 TAPERPNT 27 (SUTURE) ×1 IMPLANT
TOWEL OR 17X24 6PK STRL BLUE (TOWEL DISPOSABLE) ×6 IMPLANT

## 2021-11-13 NOTE — Anesthesia Procedure Notes (Signed)
Spinal ? ?Patient location during procedure: OB ?Start time: 11/13/2021 12:40 AM ?End time: 11/13/2021 12:43 AM ?Reason for block: surgical anesthesia ?Staffing ?Performed: anesthesiologist  ?Anesthesiologist: Trevor Iha, MD ?Preanesthetic Checklist ?Completed: patient identified, IV checked, risks and benefits discussed, surgical consent, monitors and equipment checked, pre-op evaluation and timeout performed ?Spinal Block ?Patient position: sitting ?Prep: DuraPrep and site prepped and draped ?Patient monitoring: heart rate, cardiac monitor, continuous pulse ox and blood pressure ?Approach: midline ?Location: L3-4 ?Injection technique: single-shot ?Needle ?Needle type: Pencan  ?Needle gauge: 24 G ?Needle length: 10 cm ?Needle insertion depth: 5 cm ?Assessment ?Sensory level: T4 ?Events: CSF return ?Additional Notes ? 1 Attempt (s). Pt tolerated procedure well. ? ? ? ?

## 2021-11-13 NOTE — Lactation Note (Addendum)
This note was copied from a baby's chart. ?Lactation Consultation Note ? ?Patient Name: Susan Morgan ?Today's Date: 11/13/2021 ?Reason for consult: Follow-up assessment;1st time breastfeeding;Primapara;Term ?Age:23 hours ? ? ?P1 mother whose infant is now 66 hours old.  This is a term baby at 40+1 weeks.  Mother's current feeding preference is breast. ? ?Mother requested latch assistance. ? ?Mother was attempting to latch baby when I arrived.  Baby showing cues and frantically searching for the breast.  Offered to assist and mother receptive.  Reviewed hand expression from my earlier visit.  Mother was not interested in doing hand expression with the technique I was using; unable to express drops.  Assisted to latch in the cross cradle hold briefly, however, once latched baby slipped off the breast.  Showed mother how to lift and support breast; mother not interested in following my suggestions.  She immediately used the cradle position with no head support and baby was unable to obtain a latch.  She continued to hold baby in a position that was not conducive to breast feeding. I showed her how to support the head and mother did not like me holding the baby behind her ears.  She continued to let her head flop with no support.  Politely explained why this position was not working and mother continued to disregard any suggestions from me.  Baby continued to search unsuccessfully.  Mother informed me that baby was tired and she was going to let her rest.   ? ?Provided a manual pump with instructions for use to help pre-pump for nipple eversion.  #21 flange is appropriate at this time.   ? ?Continue to teach and reinforce latching techniques and basic breast feeding concepts.  I do not anticipate baby being able to latch well without further guidance from the RN/LC team.  Suggested hand expression and feeding back any EBM she obtains to baby.  Father present; suggested father could assist with STS but uncertain  as to whether or not mother will allow this.  RN and pediatrician updated. ? ? ?Maternal Data ?  ? ?Feeding ?Mother's Current Feeding Choice: Breast Milk ? ?LATCH Score ?Latch: Too sleepy or reluctant, no latch achieved, no sucking elicited. ? ?Audible Swallowing: None ? ?Type of Nipple: Everted at rest and after stimulation (short shafted) ? ?Comfort (Breast/Nipple): Soft / non-tender ? ?Hold (Positioning): Assistance needed to correctly position infant at breast and maintain latch. ? ?LATCH Score: 5 ? ? ?Lactation Tools Discussed/Used ?  ? ?Interventions ?Interventions: Breast feeding basics reviewed;Assisted with latch;Skin to skin;Breast massage;Hand express;Breast compression;Position options;Support pillows;Adjust position;Education ? ?Discharge ?Pump: Manual ? ?Consult Status ?Consult Status: Follow-up ?Date: 11/14/21 ?Follow-up type: In-patient ? ? ? ?Bhavana Kady R Leni Pankonin ?11/13/2021, 8:11 AM ? ? ? ?

## 2021-11-13 NOTE — Anesthesia Postprocedure Evaluation (Signed)
Anesthesia Post Note ? ?Patient: Susan Morgan ? ?Procedure(s) Performed: DILATATION AND CURETTAGE ? ?  ? ?Patient location during evaluation: Mother Baby ?Anesthesia Type: Spinal ?Level of consciousness: oriented and awake and alert ?Pain management: pain level controlled ?Vital Signs Assessment: post-procedure vital signs reviewed and stable ?Respiratory status: spontaneous breathing and respiratory function stable ?Cardiovascular status: blood pressure returned to baseline and stable ?Postop Assessment: no headache, no backache, no apparent nausea or vomiting and able to ambulate ?Anesthetic complications: no ? ? ?No notable events documented. ? ?Last Vitals:  ?Vitals:  ? 11/13/21 0245 11/13/21 0257  ?BP: 102/67 110/65  ?Pulse: (!) 106 (!) 108  ?Resp: 17 18  ?Temp:  37.2 ?C  ?SpO2: 98% 100%  ?  ?Last Pain:  ?Vitals:  ? 11/13/21 0257  ?TempSrc: Oral  ?PainSc: 0-No pain  ? ?Pain Goal:   ? ?  ?  ?  ?  ?  ?  ?  ? ?Barnet Glasgow ? ? ? ? ?

## 2021-11-13 NOTE — Consult Note (Signed)
[facility] Psychiatry New Face-to-Face Psychiatric Evaluation ? ? ?Service Date: November 13, 2021 ?LOS:  LOS: 1 day  ? ? ?Assessment  ?Susan Morgan is a 21 y.o. female admitted medically for 11/12/2021  6:36 PM for term delivery of infant. She carries the psychiatric diagnoses of brief psychotic disorder and has a past medical history of marijuana use. Psychiatry was consulted for concerns for untreated schizophrenia by Susan Morgan.  ? ? ?Her current presentation of long-standing bizarre beliefs that vary significantly from population norms is most consistent with schizotypal personality disorder. Beliefs more c/w overvalued ideas than frank delusions. She denies any overt AH/VH and other first rank symptoms of psychosis on exam, and exam is additionally unremarkable for thought disorganization, pt RIS, flight of ideas, grandiosity, thought blocking, perseveration, neologism, etc. She does have a history of a brief psychotic disorder (vs schizophreniform disorder) in 2021 however both pt and partner at beside (of 2 years) denies any further psychotic episodes since that time. She took risperidone for a few months after this episode but hasn't taken for at least a year.  She is on no outpatient psychotropic medications. Does not meet diagnostic criteria for schizophrenia based on exam today.  ? ?On initial examination, patient was pleasant, cooperative, future oriented, although somewhat frustrated at the implication that her beliefs make her "crazy". Please see plan below for detailed recommendations.  ? ?Diagnoses:  ?Active Hospital problems: ?Principal Problem: ?  Vaginal delivery ?Active Problems: ?  Marijuana abuse ?  ? ? ?Plan  ?## Safety and Observation Level:  ?- Based on my clinical evaluation, I estimate the patient to be at low risk of self harm in the current setting ?- At this time, we recommend a routine level of observation. This decision is based on my review of the chart including  patient's history and current presentation, interview of the patient, mental status examination, and consideration of suicide risk including evaluating suicidal ideation, plan, intent, suicidal or self-harm behaviors, risk factors, and protective factors. This judgment is based on our ability to directly address suicide risk, implement suicide prevention strategies and develop a safety plan while the patient is in the clinical setting. Please contact our team if there is a concern that risk level has changed. ? ? ?## Medications:  ?-- none recommended ? ? ?## Medical Decision Making Capacity:  ?Not formally asssessed ? ?## Further Work-up:  ?-- per primary ? ? ? ?-- most recent EKG on 11/17/2019 had QtC of 457 ? ? ?## Disposition:  ?-- per primary ? ?## Behavioral / Environmental:  ?-- please discuss concerns for pt and/or child with pt directly  ? ?Thank you for this consult request. Recommendations have been communicated to the primary team.  We will continue to follow at this time.  ? ?Susan Morgan A [name] ? ? ?New history  ?Relevant Aspects of Hospital Course:  ?Admitted on 11/12/2021 for precipitous labor at home. Required repair of lacerations. Has opted for a "lotus birth" in which she is waiting for placenta to fall off. Some difficulty with breastfeeding per lactation consultant notes.  ? ?Patient Report:  ?Patient seen in AM.  Her partner remains at bedside per patient preference.  Discussed reasons for psychiatric consult. She is fully alert and oriented and able to perform MOYB with minimal difficulty (10/12).  ? ?Discussed patient's presentation for a brief psychotic disorder 2 years ago.  She does not remember much of delusions endorsed at that time, but does newly endorsed that  she was drinking heavily around this time and was afraid to tell the team because she was not yet 19.  She has since realized that alcohol turns her into a person she does not like or respect and has stopped drinking entirely.   Has had additional incidents of blacking out, seeing videos of herself that she is not proud of but denies any additional episodes of psychosis.  Had seen a school psychologist after the incident but not followed up with a psychiatrist-took the risperidone until the bottle ran out but has not taken any since her knowledge. ? ?Patient and partner expressed some frustration with psychiatry consult and state they feel that pt's beliefs.  Patient reports that she has been planning a lotus birth for at least several months, citing that "the [place] stated". Does not plan to ingest placenta or make it into pills.  This is in line with her general belief system which includes an emphasis on organic and plant-based ingredients.  She does plan to breast-feed her daughter and if that does not work plans to ideally pump and provide 100% of nutritional needs versus supplementing with a plant-based formula.  Partner at bedside confirms that this is a longstanding belief system which does not typically cause problems for the pt outside of the medical setting.  ? ?Patient denies planning on home birth-discusses trauma of going to bathroom and realizing that she was giving birth.  At some point after the fire department arrived, cops were called when she refused to have umbilical cord cut -patient reports further trauma when latch disrupted when she required operative intervention of lacerations.  Discusses experience when she first got to the hospital as very validating with first set of nurses/midwives supporting her choice for lotus birth. ? ?She is able to discuss the most commonly cited benefits for a lotus birth (iron) and the known risks (infection).  Patient and partner are aware that baby has been tachycardic and team is worried about infection. ? ?On brief review of symptoms, denies any symptoms concerning for depression (was "grouchy" and last week of pregnancy).  Denies any auditory/visual hallucinations and other  first rank schizophrenia such as thought insertion, thought broadcasting/withdrawal, etc.  Had slept very well up until delivery. ? ?Attempted to normalize many postpartum interventions (frequent weight checks, etc.). ? ?Had long discussion of psychiatric risks during postpartum period with both patient and partner, strongly emphasizing need for protected sleep and reasons for coming to the emergency room. ? ? ? ? ? ?ROS:  ?Incoroporated into HIP ? ?Collateral information:  ?Partner present for entirety of interview. Spoke to him briefly outside the room - he had not noted anything ordinary for pt. Assured him that pt's odd beliefs are not a problem so long as they do not pose a risk to baby ? ?Psychiatric History:  ?Information collected from pt, medical record ? ?Social History:  ?Lives with partner of 2 years ? ?Tobacco use: no ?Alcohol use: no ? ?Family History:  ?The patient's family history is not on file. ? ?Medical History: ?Past Medical History:  ?Diagnosis Date  ? Acute kidney injury (HCC) 11/14/2019  ? Acute psychosis (HCC) 11/14/2019  ? Hypertension   ? Mental disorder   ? ? ?Surgical History: ?Past Surgical History:  ?Procedure Laterality Date  ? NO PAST SURGERIES    ? ? ?Medications:  ? ?Current Facility-Administered Medications:  ?  acetaminophen (TYLENOL) tablet 650 mg, 650 mg, Oral, Q4H PRN, [place] BingPickens, Charlie, MD ?  benzocaine-Menthol (DERMOPLAST)  20-0.5 % topical spray 1 application., 1 application., Topical, PRN, [place] Bing, MD ?  coconut oil, 1 application., Topical, PRN, [place] Bing, MD ?  witch hazel-glycerin (TUCKS) pad 1 application., 1 application., Topical, PRN **AND** dibucaine (NUPERCAINAL) 1 % rectal ointment 1 application., 1 application., Rectal, PRN, [place] Bing, MD ?  diphenhydrAMINE (BENADRYL) capsule 25 mg, 25 mg, Oral, Q6H PRN, [place] Bing, MD ?  ibuprofen (ADVIL) tablet 600 mg, 600 mg, Oral, Q6H, Pickens, Charlie, MD, 600 mg at 11/13/21 1134 ?  lactated ringers  infusion 500-1,000 mL, 500-1,000 mL, Intravenous, PRN, [place] Bing, MD ?  lactated ringers infusion, , Intravenous, Continuous,  Bing, MD, Last Rate: 125 mL/hr at 11/13/21 0312, New Bag at 04

## 2021-11-13 NOTE — Transfer of Care (Signed)
Immediate Anesthesia Transfer of Care Note ? ?Patient: Susan Morgan ? ?Procedure(s) Performed: DILATATION AND CURETTAGE ? ?Patient Location: PACU ? ?Anesthesia Type:Spinal ? ?Level of Consciousness: awake, alert , oriented and patient cooperative ? ?Airway & Oxygen Therapy: Patient Spontanous Breathing ? ?Post-op Assessment: Report given to RN and Post -op Vital signs reviewed and stable ? ?Post vital signs: Reviewed and stable ? ?Last Vitals:  ?Vitals Value Taken Time  ?BP 101/51 11/13/21 0145  ?Temp 37.4 ?C 11/13/21 0140  ?Pulse 131 11/13/21 0146  ?Resp 25 11/13/21 0146  ?SpO2 100 % 11/13/21 0146  ?Vitals shown include unvalidated device data. ? ?Last Pain:  ?Vitals:  ? 11/13/21 0140  ?TempSrc: Oral  ?PainSc: 0-No pain  ?   ? ?  ? ?Complications: No notable events documented. ?

## 2021-11-13 NOTE — Op Note (Signed)
Operative Note  ? ?11/13/2021 ? ?PRE-OP DIAGNOSIS: Persistent postpartum bleeding. Inadequate in room anesthesia ?  ?POST-OP DIAGNOSIS: Same. Bleeding perineal laceration ? ?SURGEON: Surgeon(s) and Role: ?   *  Bing, MD - Primary ? ?ASSISTANT:    Mathis Fare, Valentina Shaggy, MD - Assisting [facility] Fellow) ?An experienced assistant was required given the standard of surgical care given the complexity of the case.  This assistant was needed for exposure, dissection, suctioning, retraction, instrument exchange,  assisting with overall help during the procedure.  ? ?PROCEDURE:  Exam under anesthesia. Repair of second degree perineal laceration ? ?ANESTHESIA: Spinal ? ?ESTIMATED BLOOD LOSS: 14mL for the case ? ?DRAINS: indwelling foley. UOP per OR report ? ?TOTAL IV FLUIDS: per OR report ? ?SPECIMENS: None ? ?VTE PROPHYLAXIS: SCDs to the bilateral lower extremities ? ?ANTIBIOTICS: None ? ?COMPLICATIONS: none ? ?DISPOSITION: PACU - hemodynamically stable. ? ?CONDITION: stable ? ?FINDINGS: Exam under anesthesia revealed 12-14 week sized uterus with no masses and bilateral adnexa without masses or fullness and firm fundus  and lower uterine segment and no bleeding with massage.  ? ?Cervix exam circumferentially and no lacerations noted. ? ?2nd degree perineal laceration noted with steady oozing and bleeding near the apex and right vaginal sidewall. Rectal exam pre and post procedure. ? ?PROCEDURE IN DETAIL:  After informed consent was obtained, the patient was taken to the operating room where anesthesia was obtained without difficulty. The patient was positioned in the dorsal lithotomy position in [place] stirrups. The patient was examined under anesthesia, with the above noted findings.    ? ?Prior in room repair suture removed and standard repair done with 2-0 chromic with excellent hemostasis ? ?Excellent hemostasis was noted, and all instruments were removed, with excellent hemostasis noted throughout.  She was then  taken out of dorsal lithotomy. ? ?The patient tolerated the procedure well.  Sponge, lap and instrument counts were correct x2.  The patient was taken to recovery room in excellent condition. ? ?Cornelia Copa MD ?Attending ?[facility] [employer] [facility]) ? ?

## 2021-11-13 NOTE — Anesthesia Preprocedure Evaluation (Signed)
Anesthesia Evaluation  ?Patient identified by MRN, date of birth, ID band ? ?Reviewed: ?Allergy & Precautions, NPO status , Patient's Chart, lab work & pertinent test results ? ?Airway ?Mallampati: II ? ?TM Distance: >3 FB ?Neck ROM: Full ? ? ? Dental ?no notable dental hx. ?(+) Teeth Intact ?  ?Pulmonary ?neg pulmonary ROS,  ?  ?Pulmonary exam normal ?breath sounds clear to auscultation ? ? ? ? ? ? Cardiovascular ?hypertension, Pt. on medications ?Normal cardiovascular exam ?Rhythm:Regular Rate:Normal ? ? ?  ?Neuro/Psych ?negative neurological ROS ?   ? GI/Hepatic ?negative GI ROS, Neg liver ROS,   ?Endo/Other  ?negative endocrine ROS ? Renal/GU ?  ? ?  ?Musculoskeletal ? ? Abdominal ?  ?Peds ? Hematology ?Lab Results ?     Component                Value               Date                 ?     WBC                      34.0 (H)            11/12/2021           ?     HGB                      11.1 (L)            11/12/2021           ?     HCT                      34.6 (L)            11/12/2021           ?     MCV                      82.8                11/12/2021           ?     PLT                      418 (H)             11/12/2021           ?Marland Kitchen   ?Anesthesia Other Findings ? ? Reproductive/Obstetrics ?(+) Pregnancy ? ?  ? ? ? ? ? ? ? ? ? ? ? ? ? ?  ?  ? ? ? ? ? ? ? ? ?Anesthesia Physical ?Anesthesia Plan ? ?ASA: 2 and emergent ? ?Anesthesia Plan: Spinal  ? ?Post-op Pain Management: Regional block*  ? ?Induction:  ? ?PONV Risk Score and Plan: Treatment may vary due to age or medical condition, Ondansetron and Midazolam ? ?Airway Management Planned: Natural Airway and Nasal Cannula ? ?Additional Equipment: None ? ?Intra-op Plan:  ? ?Post-operative Plan:  ? ?Informed Consent: I have reviewed the patients History and Physical, chart, labs and discussed the procedure including the risks, benefits and alternatives for the proposed anesthesia with the patient or authorized representative  who has indicated his/her understanding and acceptance.  ? ? ? ?Dental advisory given ? ?Plan Discussed with: CRNA and Anesthesiologist ? ?Anesthesia Plan  Comments:   ? ? ? ? ? ? ?Anesthesia Quick Evaluation ? ?

## 2021-11-13 NOTE — Progress Notes (Addendum)
During shift change report in the room, infant sts on mom's chest and turning head, licking lips. RN asked mom if she would feed the baby, because baby appeared hungry. Mom said she didn't have much milk. RN explained hand expression and assisted pt in expressing drops of colostrum. Pt stated it was the most she had seen and smiled. RN then instructed mom how to place the baby to guide her to the breast. Mom attempted a couple times and then stated the baby wasn't hungry. RN explained the need to eat to maintain baby's energy to eat. Pt refused further assistance from RN but stated she would allow Lactation to see her again. RN called LC to room. Beth Delfava on her way ?

## 2021-11-13 NOTE — Lactation Note (Signed)
This note was copied from a baby's chart. ?Lactation Consultation Note ? ?Patient Name: Susan Morgan ?Today's Date: 11/13/2021 ?Reason for consult: Term;1st time breastfeeding;Primapara;Initial assessment ?Age:23 hours ? ? ?P1 mother whose infant is now 56 hours old.  This is a term baby at 40+1 weeks.  Mother's current feeding preference is breast. ? ?Baby (No name yet) was asleep in mother's lap when I arrived.  Mother has chosen a lotus birth and the placenta and umbilical cord are still attached.  Mother plans to keep this attached until it falls off on its own. ? ?Reviewed feeding cues and breast feeding basics.  Mother is not interested in waking baby at this time.  Taught hand expression; no drops noted.  Encouraged feeding on cue or at least 8-12 times/24 hours.  Offered to return for latch assistance as needed.  Father present. ? ? ?Maternal Data ?Has patient been taught Hand Expression?: Yes ?Does the patient have breastfeeding experience prior to this delivery?: No ? ?Feeding ?Mother's Current Feeding Choice: Breast Milk ? ?LATCH Score ?  ? ?  ? ?  ? ?  ? ?  ? ?  ? ? ?Lactation Tools Discussed/Used ?  ? ?Interventions ?Interventions: Breast feeding basics reviewed;Education ? ?Discharge ?  ? ?Consult Status ?Consult Status: Follow-up ?Date: 11/14/21 ?Follow-up type: In-patient ? ? ? ?Susan Morgan ?11/13/2021, 4:27 AM ? ? ? ?

## 2021-11-13 NOTE — Progress Notes (Signed)
Pt refusing fundal rub & for CNM to check perineum.   ?

## 2021-11-13 NOTE — Clinical Social Work Maternal (Signed)
?CLINICAL SOCIAL WORK MATERNAL/CHILD NOTE ? ?Patient Details  ?Name: Susan Morgan ?MRN: [id number] ?Date of Birth: 09-27-2000 ? ?Date:  11/13/2021 ? ?Clinical Social Worker Initiating Note:  Kathrin Greathouse, LCSW Date/Time: Initiated:  11/13/21/1458    ? ?Child's Name:  Susan Morgan  ? ?Biological Parents:  Mother, Father (MOB: Susan Morgan [birth year] 1999, FOB: Susan Morgan 02-24-1977)  ? ?Need for Interpreter:  None  ? ?Reason for Referral:  Late or No Prenatal Care  , Behavioral Health Concerns  ? ?Address:  [address] ?[place] [address]  ?  ?Phone number:  [phone] (home)    ? ?Additional phone number:  ? ?Household Members/Support Persons (HM/SP):   Household Member/Support Person 1 ? ? ?HM/SP Name Relationship DOB or Age  ?HM/SP -1 Susan Morgan Spouse 02-24-1977  ?HM/SP -2        ?HM/SP -3        ?HM/SP -4        ?HM/SP -5        ?HM/SP -6        ?HM/SP -7        ?HM/SP -8        ? ? ?Natural Supports (not living in the home):     ? ?Professional Supports: None  ? ?Employment:  (FOB employed [employer])  ? ?Type of Work:    ? ?Education:  Some College  ? ?Homebound arranged:   ? ?Financial Resources:  Medicaid  ? ?Other Resources:  WIC, Food Stamps    ? ?Cultural/Religious Considerations Which May Impact Care:   ? ?Strengths:     ? ?Psychotropic Medications:        ? ?Pediatrician:      ? ?Pediatrician List:  ? ?    ?[place]    ?[facility]    ?[facility]    ?[facility]    ?[facility]    ? ? ?Pediatrician Fax Number:   ? ?Risk Factors/Current Problems:     ? ?Cognitive State:  Able to Concentrate  , Insightful  , Alert  , Linear Thinking    ? ?Mood/Affect:  Bright  , Calm  , Interested  , Happy    ? ?CSW Assessment: CSW received consult Postpartum. H/o schizophrenia(?). No prenatal care. Had homebirth. CSW met with MOB to offer support and complete assessment.   ? ?CSW met with MOB at bedside and introduced CSW role. CSW observed MOB up in the  room, infant asleep in the bassinet and FOB present at bedside. MOB presented pleasant, welcomed CSW and engaged well. MOB did not appear to be responding to internal stimuli, hallucinating visually or auditorily. CSW offered MOB privacy. MOB preferred FOB be present during the assessment. MOB confirmed that the demographic information on hospital file is correct. MOB reported she and FOB live together and identified FOB as her support. MOB reported that she is currently unemployed, and FOB is employed at [employer].  MOB reported that she receives FS and is interested in applying for [facility]. MOB reported she is interested in receiving a breast pump. CSW provided education on how to follow up and offered to assist with scheduling the appointment.  ? ?CSW inquired how MOB has been feeling since giving birth. MOB shared that she had a good pregnancy with the support of her spouse and Mid Wife Vern Claude at [employer]. MOB reported that she received her prenatal care with her midwife by phone calls. MOB reported she received basic  education on birth and parenting. MOB shared that she was excited to give birth and planned extensively to have a Lotus Birth and holistic pregnancy. MOB shared how her diet is plant based, with spices and herbs. MOB disclosed that she did not plan to have a home birth, but it was precipitous birth.  MOB shared when the infant was delivered FOB immediately called EMS for medical support. MOB reported that the EMS team did not understand the [place] Birth and demanded the umbilical cord be cut which was ?devastating? because this is a part of her beliefs. MOB expressed feeling judged since they did not understand her reasoning or try to understand. MOB reported once arriving to the hospital she felt supported by one Midwife who understood the [place] Birth. MOB reported that she suffered severe lacerations and felt ?traumatized? with how she was handled. MOB expressed that she feels that  the staff are not being honest as she was not given explanation to why psychiatry was called in to see her. ?They think I am crazy or something.? MOB reported, ?if they have concerns or questions, they can ask me.? FOB politely interjected and stated that he was told by nurse staff to ?accidentally cut the cord.? FOB expressed frustration with being asked to do this and does not feel his wife is receiving the best care. FOB reported it's like ?they saw her history have been judging her accordingly.? ? ?MOB shared that she has breastfeed the infant five times and FOB recorded the last time she breastfed the infant so that she can show the medical team since ?they don't believe me.? MOB expressed, ?they need to be patient, my baby is not two days old. I lay her on my chest watch her guide to my breast.? MOB expressed that she was frustrated with the way lactation ?handled my baby neck? while trying to get her to latch. MOB asked for the nurse supervisor's email so that she can follow up on her experience when she was home.  CSW provided actively listening, acknowledged MOB, FOB concerns and offered the contact information to Patient Experience. MOB then expressed she would like to use a different Science writer. CSW assisted MOB in informing the nurse of her wishes. CSW encouraged MOB to work with lactation to express her wants and concerns with breastfeeding. CSW encouraged MOB to ask for a breast pump if that is what she desires to use. MOB reported understanding and agreed.  ? ?CSW inquired about MOB mental health history. MOB reported, ?I had an episode about two years ago.? MOB acknowledged schizophreniform as the diagnosis. MOB shared, ?I read my chart and they said I walked into an unknown person apartment. I knew him, he wanted to date but I did not want to date him.? MOB reported that during this time she was underage drinking and using ?trees? (marijuana) and did not want to disclose this to the  medical staff in fear of her mother finding out. MOB denied current substance use during the pregnancy. MOB reported no concerns since then. MOB denied visual or auditory hallucinations. MOB reported taking psychotropic medication in the past and receiving outpatient counseling at [facility] that she felt was helpful at the time. CSW discussed PPD. CSW provided education regarding the baby blues period vs. perinatal mood disorders, discussed treatment and gave resources for mental health follow up if concerns arise.  CSW recommended MOB complete a self-evaluation during the postpartum time period using the New Mom Checklist from  Postpartum Progress and encouraged MOB to contact a medical professional if symptoms are noted at any time. MOB reported she feels comfortable reaching out to a medical professional if concerns arise. FOB reported that will be at home with MOB for the next two weeks to provide support and will help monitor her as well. CSW assessed MOB for safety.  ? ?CSW informed MOB about the hospital drug screen policy and that a UDS/CDS will be performed on the infant. CSW made MOB aware that CSW will monitor the infant UDS/CDS and make a report to CPS, if warranted. MOB reported understanding.  ? ?CSW provided review of Sudden Infant Death Syndrome (SIDS) precautions. MOB reported understanding. MOB reported that she essential items for the infant including a bassinet where the infant will sleep. MOB reported she is still deciding on pediatrician.  ? ?CSW will assist MOB with [facility] appointment.  ? ?CSW will continue to monitor the infant's UDS/CDS and make a report to CPS, if warranted.  ? ?CSW identifies no further need for intervention and no barriers to discharge at this time.  ? ? ?CSW Plan/Description:  Sudden Infant Death Syndrome (SIDS) Education, CSW Will Continue to Monitor Umbilical Cord Tissue Drug Screen Results and Make Report if Warranted, Other Information/Referral to [place], No Further Intervention Required/No Barriers to Discharge, Perinatal Mood and Anxiety Disorder (PMADs) Education  ? ? ?Lia Hopping, LCSW ?11/13/2021, 4:54PM

## 2021-11-13 NOTE — Progress Notes (Addendum)
Post Partum Day #1 ?Subjective: ?no complaints and tolerating PO; she still has foley catheter in place.  ? ?Objective: ?Blood pressure 117/66, pulse (!) 109, temperature 98.9 ?F (37.2 ?C), temperature source Oral, resp. rate 16, SpO2 100 %, unknown if currently breastfeeding. ? ?Physical Exam:  ?General: alert, cooperative, and no distress ?Lochia: appropriate ?Uterine Fundus: soft ?Incision: NA ?DVT Evaluation: No evidence of DVT seen on physical exam. ? ?Recent Labs  ?  11/12/21 ?2047 11/13/21 ?0512  ?HGB 11.1* 7.7*  ?HCT 34.6* 23.0*  ? ? ?Patient Vitals for the past 24 hrs: ? BP Temp Temp src Pulse Resp SpO2  ?11/13/21 0555 117/66 98.9 ?F (37.2 ?C) Oral (!) 109 16 100 %  ?11/13/21 0456 116/65 99 ?F (37.2 ?C) Oral (!) 123 18 100 %  ?11/13/21 0355 129/78 99.5 ?F (37.5 ?C) Oral (!) 122 18 100 %  ?11/13/21 0257 110/65 99 ?F (37.2 ?C) Oral (!) 108 18 100 %  ?11/13/21 0245 102/67 -- -- (!) 106 17 98 %  ?11/13/21 0230 (!) 98/58 -- -- 98 18 100 %  ?11/13/21 0215 (!) 101/50 98.4 ?F (36.9 ?C) Oral (!) 102 17 100 %  ?11/13/21 0200 103/76 -- -- (!) 108 18 100 %  ?11/13/21 0145 (!) 101/51 -- -- (!) 139 15 100 %  ?11/13/21 0140 91/79 99.4 ?F (37.4 ?C) Oral (!) 153 -- --  ?11/13/21 0016 124/76 -- -- (!) 152 20 --  ?11/13/21 0001 122/87 -- -- (!) 147 -- --  ?11/12/21 2331 118/62 -- -- (!) 126 -- --  ?11/12/21 2316 139/74 -- -- (!) 109 -- --  ?11/12/21 2303 132/69 -- -- (!) 123 -- --  ?11/12/21 2301 (!) 127/109 -- -- (!) 133 -- --  ?11/12/21 2300 -- -- -- -- -- 100 %  ?11/12/21 2246 120/67 -- -- (!) 107 -- --  ?11/12/21 2245 -- -- -- -- -- 100 %  ?11/12/21 2231 125/75 -- -- (!) 108 -- --  ?11/12/21 2230 128/75 -- -- (!) 131 -- --  ?11/12/21 2200 120/70 -- -- 98 -- 97 %  ?11/12/21 2146 96/69 -- -- (!) 126 -- 97 %  ?11/12/21 2131 (!) 135/94 -- -- (!) 119 -- 99 %  ?11/12/21 2118 (!) 145/92 -- -- 97 -- --  ?11/12/21 2115 -- -- -- -- -- 100 %  ?11/12/21 2110 -- -- -- -- -- 100 %  ?11/12/21 2105 -- -- -- -- -- 100 %  ?11/12/21 2101  118/83 -- -- (!) 103 -- --  ?11/12/21 2100 -- -- -- -- -- (!) 82 %  ?11/12/21 2055 96/81 -- -- 72 -- --  ?11/12/21 2050 -- -- -- -- -- 96 %  ?11/12/21 2046 (!) 77/47 -- -- (!) 128 -- --  ?11/12/21 2045 -- -- -- -- -- 98 %  ?11/12/21 2017 95/70 -- -- (!) 139 -- --  ?11/12/21 2016 95/70 -- -- (!) 139 -- --  ?11/12/21 2002 -- -- -- (!) 145 -- --  ?11/12/21 1946 100/67 98.7 ?F (37.1 ?C) Oral (!) 133 20 --  ?11/12/21 1933 123/70 -- -- (!) 114 -- --  ? ? ? ?Assessment/Plan: ?Plan for discharge tomorrow ?Estimated EBL is 1000 ml; will repeat CBC at noon and D/C foley catheter at that time.  ?Plan for SW eval today ?VSS ? LOS: 1 day  ? ?Susan Morgan ?11/13/2021, 7:03 AM  ?

## 2021-11-14 ENCOUNTER — Ambulatory Visit: Payer: Self-pay

## 2021-11-14 MED ORDER — POLYSACCHARIDE IRON COMPLEX 150 MG PO CAPS: 150.0000 mg | ORAL_CAPSULE | Freq: Every day | ORAL | 0 refills | Status: AC

## 2021-11-14 MED ORDER — IBUPROFEN 600 MG PO TABS: 600.0000 mg | ORAL_TABLET | Freq: Four times a day (QID) | ORAL | 0 refills | Status: AC

## 2021-11-14 MED ORDER — ACETAMINOPHEN 325 MG PO TABS: 650.0000 mg | ORAL_TABLET | ORAL | 0 refills | Status: AC | PRN

## 2021-11-14 NOTE — Discharge Summary (Signed)
? ?  Postpartum Discharge Summary ?   ?Patient Name: Susan Morgan ?DOB: 11-21-2000 ?MRN: [id number] ? ?Date of admission: 11/12/2021 ?Delivery date:11/12/2021  ?Delivering provider:   ?Date of discharge: 11/14/2021 ? ?Admitting diagnosis: Vaginal delivery [O80] ?Intrauterine pregnancy: [redacted]w[redacted]d    ?Secondary diagnosis:  Principal Problem: ?  Vaginal delivery ?Active Problems: ?  Marijuana abuse ?Untreated Schizophrenia ?Chronic Hypertension ? ?Additional problems: N/A    ?Discharge diagnosis: Term Pregnancy Delivered and CHTN                                              ?Post partum procedures: Transition of Care consult, Psychiatric consult ?Augmentation: N/A ?Complications: None ? ?Hospital course: Home delivery of baby and placenta, [facility] ? ?Magnesium Sulfate received: No ?BMZ received: No ?Rhophylac:N/A ?MMR:No ?T-DaP: Declined ?Flu: N/A ?Transfusion:No ? ?Physical exam  ?Vitals:  ? 11/13/21 1108 11/13/21 1652 11/13/21 2047 11/14/21 0602  ?BP: 116/65 (!) 102/56 (!) 106/54 (!) 94/48  ?Pulse: (!) 110 (!) 124 (!) 107 82  ?Resp: 16 16  18   ?Temp: 98.5 ?F (36.9 ?C) 98 ?F (36.7 ?C) 98.5 ?F (36.9 ?C) 98 ?F (36.7 ?C)  ?TempSrc: Oral Oral Oral Oral  ?SpO2: 100%   100%  ? ?General: alert, cooperative, and no distress ?Lochia: appropriate ?Uterine Fundus: firm ?Incision: N/A ?DVT Evaluation: No evidence of DVT seen on physical exam. ?Labs: ?Lab Results  ?Component Value Date  ? WBC 26.9 (H) 11/13/2021  ? HGB 7.1 (L) 11/13/2021  ? HCT 21.6 (L) 11/13/2021  ? MCV 82.4 11/13/2021  ? PLT 296 11/13/2021  ? ? ?  Latest Ref Rng & Units 11/12/2021  ?  6:54 PM  ?CMP  ?Glucose 70 - 99 mg/dL 88    ?BUN 6 - 20 mg/dL 7    ?Creatinine 0.44 - 1.00 mg/dL 0.69    ?Sodium 135 - 145 mmol/L 134    ?Potassium 3.5 - 5.1 mmol/L 4.2    ?Chloride 98 - 111 mmol/L 105    ?CO2 22 - 32 mmol/L 19    ?Calcium 8.9 - 10.3 mg/dL 9.6    ?Total Protein 6.5 - 8.1 g/dL 7.4    ?Total Bilirubin 0.3 - 1.2 mg/dL 0.4    ?Alkaline  Phos 38 - 126 U/L 157    ?AST 15 - 41 U/L 23    ?ALT 0 - 44 U/L 15    ? ?Edinburgh Score: ?   ? View : No data to display.  ?  ?  ?  ? ? ? ?After visit meds:  ?Allergies as of 11/14/2021   ?No Known Allergies ?  ? ?  ?Medication List  ?  ? ?STOP taking these medications   ? ?benztropine 0.5 MG tablet ?Commonly known as: COGENTIN ?  ?omega-3 acid ethyl esters 1 g capsule ?Commonly known as: LOVAZA ?  ?risperiDONE 2 MG tablet ?Commonly known as: RISPERDAL ?  ? ?  ? ?TAKE these medications   ? ?acetaminophen 325 MG tablet ?Commonly known as: Tylenol ?Take 2 tablets (650 mg total) by mouth every 4 (four) hours as needed (for pain scale < 4). ?  ?ibuprofen 600 MG tablet ?Commonly known as: ADVIL ?Take 1 tablet (600 mg total) by mouth every 6 (six) hours. ?  ?iron polysaccharides 150 MG capsule ?Commonly known as: NIFEREX ?Take 1 capsule (150 mg total)  by mouth daily. ?  ?prenatal multivitamin Tabs tablet ?Take 1 tablet by mouth daily at 12 noon. ?  ? ?  ? ? ? ?Discharge home in stable condition ?Infant Feeding: Breast ?Infant Disposition:home with mother ?Discharge instruction: per After Visit Summary and Postpartum booklet. ?Activity: Advance as tolerated. Pelvic rest for 6 weeks.  ?Diet: routine diet ?Future Appointments:No future appointments. ?Follow up Visit: ? ?Patient has planned outpatient follow-up with her home birth midwife ? ?Day of discharge discussion with Mallie Snooks, CNM, patient verbalizes understanding that [facility] is appropriate place for pregnancy-related emergencies in the first six weeks postpartum and that she can contact [facility] to schedule postpartum visit with that team as needed. ? ?Please schedule this patient for a In person postpartum visit in 4 weeks with the following provider: Any provider (female required) ?Additional Postpartum F/U:Postpartum Depression checkup  ?Low risk pregnancy complicated by:  Precipitous home birth, PPH ?Delivery mode:  Vaginal, Spontaneous   ?Anticipated Birth Control:   Declines ? ?Inpatient management of Anemia (Hgb 7.7) declined by patient day of discharge ? ? ?Mallie Snooks, MSA, MSN, CNM ?Certified Nurse Midwife, [facility] ? ? ? ? ?

## 2021-11-14 NOTE — Discharge Instructions (Signed)

## 2021-11-14 NOTE — Lactation Note (Signed)
This note was copied from a baby's chart. ?Lactation Consultation Note ? ?Patient Name: Susan Morgan ?Today's Date: 11/14/2021 ?Reason for consult: Follow-up assessment;1st time breastfeeding ?Age:23 hours ?P1, term female infant. ?LC entered the room, dad was assisting mom with using DEBP, mom pumped 45 mls of EBM.  ?Per dad, he is supportive of  mom breastfeeding infant. ?Family's current plan is to latch infant at breast  for every feeding and  then supplement  infant with mom's on  EBM instead of formula. ?Mom will continue to work towards latching infant at the breast, will ask for latch assistance if needed for the next feeding. ?LC gave BF supplemental sheet , mom understands to offer 18-25 mls of EBM after infant latches at breast and if infant doesn't latch mom will offer 30 mls + per feeding. ?Parents understand breast milk is good at room temperature for 4 hours whereas formula must be used within 1 hour.  ?Mom will continue to breastfeed infant on demand, 8 to 12+ times within 24 hours, skin to skin. ?Mom will continue to use DEBP every 3 hours for 15 minutes on initial setting.  ?Maternal Data ?  ? ?Feeding ?Mother's Current Feeding Choice: Breast Milk and Formula ?Nipple Type: Other (bottle from home) ? ?LATCH Score ?  ? ?  ? ?  ? ?  ? ?  ? ?  ? ? ?Lactation Tools Discussed/Used ?Tools: Pump ?Breast pump type: Double-Electric Breast Pump ?Pump Education: Setup, frequency, and cleaning;Milk Storage ?Reason for Pumping: mom is working on increasing her milk supply ?Pumping frequency: Mom will pump every 3 hours for 15 minutes on inital setting. ?Pumped volume: 45 mL ? ?Interventions ?  ? ?Discharge ?  ? ?Consult Status ?Consult Status: Follow-up ?Date: 11/15/21 ?Follow-up type: In-patient ? ? ? ?Susan Morgan ?11/14/2021, 10:16 PM ? ? ? ?

## 2021-11-15 ENCOUNTER — Ambulatory Visit: Payer: Self-pay

## 2021-11-15 NOTE — Lactation Note (Signed)
This note was copied from a baby's chart. ?Lactation Consultation Note ? ?Patient Name: Susan Morgan ?Today's Date: 11/15/2021 ?Reason for consult: Follow-up assessment;1st time breastfeeding;Primapara;Term;Difficult latch ?Age:23 hours ? ?LC in to visit with P1 Mom of term baby.  Baby at 5% weight loss.  Mom is pumping and bottle feeding EBM due to difficulty latching to the breast.  Mom pumping 60-90 ml.  Breast full, but not engorged. ? ?Mom provided with a hand's free pumping band and assisted Mom in using this.  Mom has been tired and stopping pumping at 15 mins.  Talked about pumping 20-30 mins at each feeding or q 2-3 hrs during the day and 3-4 hrs at night.  ? ?Engorgement prevention and treatment reviewed.   ?Reviewed pump parts cleaning protocol and milk storage guidelines. ? ?Mom doesn't have a pump at home.  [employer] pump faxed. ?Mom aware of importance of increasing volume guidelines. ? ?Mom aware of OP lactation support.  Mom declined referral to clinic.  Mom has handout with numbers identified. ? ? ?Lactation Tools Discussed/Used ?Tools: Pump;Flanges;Bottle;Hands-free pumping top ?Flange Size: 24 ?Breast pump type: Double-Electric Breast Pump;Manual ?Pump Education: Setup, frequency, and cleaning;Milk Storage ?Reason for Pumping: support milk supply ?Pumping frequency: Encouraged q 2-3 hrs during the day and q 3-4 hrs ?Pumped volume: 90 mL ? ?Interventions ?Interventions: Breast feeding basics reviewed;Skin to skin;Breast massage;Hand express;DEBP ? ?Discharge ?Discharge Education: Engorgement and breast care;Warning signs for feeding baby;Outpatient recommendation ? ?Consult Status ?Consult Status: Complete (mother declined follow up) ?Date: 11/15/21 ?Follow-up type: Call as needed ? ? ? ?Johny Blamer E ?11/15/2021, 2:00 PM ? ? ? ?

## 2021-11-22 ENCOUNTER — Telehealth (HOSPITAL_COMMUNITY): Payer: Self-pay | Admitting: *Deleted

## 2021-11-22 NOTE — Telephone Encounter (Signed)
Hospital Discharge Follow-Up Call: ? ?Patient reports that she is doing well and has no concerns about her healing process.  EPDS today was 2 and she endorses this accurately reflects that she is doing well emotionally. ? ?Patient says that baby is well and she has no concerns about baby's health.  She reports that baby sleeps in a bassinet.  Reviewed ABCs of Safe Sleep. ?

## 2022-01-05 ENCOUNTER — Encounter (HOSPITAL_COMMUNITY): Payer: Self-pay | Admitting: Obstetrics and Gynecology
